# Patient Record
Sex: Female | Born: 1960 | Race: White | Hispanic: No | Marital: Single | Smoking: Never smoker
Health system: Southern US, Community
[De-identification: ages and names within clinical notes are randomized; demographics above are authoritative.]

## PROBLEM LIST (undated history)

## (undated) DIAGNOSIS — Z9889 Other specified postprocedural states: Secondary | ICD-10-CM

## (undated) DIAGNOSIS — M1990 Unspecified osteoarthritis, unspecified site: Secondary | ICD-10-CM

## (undated) DIAGNOSIS — Z8489 Family history of other specified conditions: Secondary | ICD-10-CM

## (undated) DIAGNOSIS — F329 Major depressive disorder, single episode, unspecified: Secondary | ICD-10-CM

## (undated) DIAGNOSIS — G43909 Migraine, unspecified, not intractable, without status migrainosus: Secondary | ICD-10-CM

## (undated) DIAGNOSIS — Z87448 Personal history of other diseases of urinary system: Secondary | ICD-10-CM

## (undated) DIAGNOSIS — M4802 Spinal stenosis, cervical region: Secondary | ICD-10-CM

## (undated) DIAGNOSIS — F3289 Other specified depressive episodes: Secondary | ICD-10-CM

## (undated) DIAGNOSIS — K219 Gastro-esophageal reflux disease without esophagitis: Secondary | ICD-10-CM

## (undated) DIAGNOSIS — J45909 Unspecified asthma, uncomplicated: Secondary | ICD-10-CM

## (undated) DIAGNOSIS — F419 Anxiety disorder, unspecified: Secondary | ICD-10-CM

## (undated) DIAGNOSIS — I499 Cardiac arrhythmia, unspecified: Secondary | ICD-10-CM

## (undated) DIAGNOSIS — G4730 Sleep apnea, unspecified: Secondary | ICD-10-CM

## (undated) DIAGNOSIS — E785 Hyperlipidemia, unspecified: Secondary | ICD-10-CM

## (undated) DIAGNOSIS — C9290 Myeloid leukemia, unspecified, not having achieved remission: Secondary | ICD-10-CM

## (undated) HISTORY — DX: Spinal stenosis, cervical region: M48.02

## (undated) HISTORY — DX: Sleep apnea, unspecified: G47.30

## (undated) HISTORY — DX: Major depressive disorder, single episode, unspecified: F32.9

## (undated) HISTORY — DX: Migraine, unspecified, not intractable, without status migrainosus: G43.909

## (undated) HISTORY — DX: Myeloid leukemia, unspecified, not having achieved remission: C92.90

## (undated) HISTORY — DX: Hyperlipidemia, unspecified: E78.5

## (undated) HISTORY — DX: Personal history of other diseases of urinary system: Z87.448

## (undated) HISTORY — DX: Other specified postprocedural states: Z98.89

## (undated) HISTORY — DX: Unspecified asthma, uncomplicated: J45.909

## (undated) HISTORY — DX: Other specified depressive episodes: F32.89

## (undated) HISTORY — DX: Cardiac arrhythmia, unspecified: I49.9

---
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
## 1984-08-16 HISTORY — PX: SPINAL FUSION: SHX223

## 1998-01-10 ENCOUNTER — Other Ambulatory Visit: Admission: RE | Admit: 1998-01-10 | Discharge: 1998-01-10 | Payer: Self-pay | Admitting: Obstetrics and Gynecology

## 1999-05-01 ENCOUNTER — Other Ambulatory Visit: Admission: RE | Admit: 1999-05-01 | Discharge: 1999-05-01 | Payer: Self-pay | Admitting: Obstetrics and Gynecology

## 2000-05-18 ENCOUNTER — Other Ambulatory Visit: Admission: RE | Admit: 2000-05-18 | Discharge: 2000-05-18 | Payer: Self-pay | Admitting: Obstetrics and Gynecology

## 2001-02-07 ENCOUNTER — Ambulatory Visit (HOSPITAL_COMMUNITY)
Admission: RE | Admit: 2001-02-07 | Discharge: 2001-02-07 | Payer: Self-pay | Admitting: Physical Medicine and Rehabilitation

## 2001-02-07 ENCOUNTER — Encounter: Payer: Self-pay | Admitting: Physical Medicine and Rehabilitation

## 2001-04-10 ENCOUNTER — Other Ambulatory Visit: Admission: RE | Admit: 2001-04-10 | Discharge: 2001-04-10 | Payer: Self-pay | Admitting: Family Medicine

## 2003-09-20 ENCOUNTER — Other Ambulatory Visit: Admission: RE | Admit: 2003-09-20 | Discharge: 2003-09-20 | Payer: Self-pay | Admitting: Family Medicine

## 2006-05-05 ENCOUNTER — Ambulatory Visit: Payer: Self-pay | Admitting: Family Medicine

## 2006-05-16 ENCOUNTER — Ambulatory Visit: Payer: Self-pay | Admitting: Family Medicine

## 2006-09-05 ENCOUNTER — Ambulatory Visit: Payer: Self-pay | Admitting: Family Medicine

## 2006-09-12 ENCOUNTER — Ambulatory Visit: Payer: Self-pay | Admitting: Family Medicine

## 2006-11-30 ENCOUNTER — Ambulatory Visit: Payer: Self-pay | Admitting: Family Medicine

## 2006-12-15 ENCOUNTER — Ambulatory Visit: Payer: Self-pay | Admitting: Family Medicine

## 2006-12-22 ENCOUNTER — Ambulatory Visit: Payer: Self-pay | Admitting: Family Medicine

## 2006-12-30 ENCOUNTER — Ambulatory Visit: Payer: Self-pay | Admitting: Family Medicine

## 2007-01-06 ENCOUNTER — Ambulatory Visit: Payer: Self-pay | Admitting: Family Medicine

## 2007-01-12 ENCOUNTER — Ambulatory Visit: Payer: Self-pay | Admitting: Family Medicine

## 2007-01-19 ENCOUNTER — Ambulatory Visit: Payer: Self-pay | Admitting: Family Medicine

## 2007-01-26 ENCOUNTER — Ambulatory Visit: Payer: Self-pay | Admitting: Family Medicine

## 2007-02-02 ENCOUNTER — Ambulatory Visit: Payer: Self-pay | Admitting: Family Medicine

## 2007-02-09 ENCOUNTER — Ambulatory Visit: Payer: Self-pay | Admitting: Family Medicine

## 2007-02-24 ENCOUNTER — Ambulatory Visit: Payer: Self-pay | Admitting: Family Medicine

## 2007-03-10 ENCOUNTER — Ambulatory Visit: Payer: Self-pay | Admitting: Family Medicine

## 2007-03-17 ENCOUNTER — Ambulatory Visit: Payer: Self-pay | Admitting: Family Medicine

## 2007-03-31 ENCOUNTER — Ambulatory Visit: Payer: Self-pay | Admitting: Family Medicine

## 2007-04-07 ENCOUNTER — Ambulatory Visit: Payer: Self-pay | Admitting: Family Medicine

## 2007-04-14 ENCOUNTER — Ambulatory Visit: Payer: Self-pay | Admitting: Family Medicine

## 2007-04-25 ENCOUNTER — Ambulatory Visit: Payer: Self-pay | Admitting: Family Medicine

## 2007-05-02 ENCOUNTER — Ambulatory Visit: Payer: Self-pay | Admitting: Family Medicine

## 2007-05-08 ENCOUNTER — Ambulatory Visit: Payer: Self-pay | Admitting: Family Medicine

## 2007-05-15 ENCOUNTER — Ambulatory Visit: Payer: Self-pay | Admitting: Family Medicine

## 2007-05-22 ENCOUNTER — Ambulatory Visit: Payer: Self-pay | Admitting: Family Medicine

## 2007-05-29 ENCOUNTER — Ambulatory Visit: Payer: Self-pay | Admitting: Family Medicine

## 2007-06-05 ENCOUNTER — Ambulatory Visit: Payer: Self-pay | Admitting: Family Medicine

## 2007-06-08 ENCOUNTER — Encounter: Admission: RE | Admit: 2007-06-08 | Discharge: 2007-06-08 | Payer: Self-pay | Admitting: Orthopedic Surgery

## 2007-06-13 ENCOUNTER — Ambulatory Visit: Payer: Self-pay | Admitting: Family Medicine

## 2007-06-30 ENCOUNTER — Ambulatory Visit: Payer: Self-pay | Admitting: Family Medicine

## 2007-07-10 ENCOUNTER — Ambulatory Visit: Payer: Self-pay | Admitting: Family Medicine

## 2007-07-24 ENCOUNTER — Ambulatory Visit: Payer: Self-pay | Admitting: Family Medicine

## 2007-07-28 ENCOUNTER — Ambulatory Visit (HOSPITAL_COMMUNITY): Admission: RE | Admit: 2007-07-28 | Discharge: 2007-07-28 | Payer: Self-pay | Admitting: Orthopedic Surgery

## 2007-08-01 ENCOUNTER — Ambulatory Visit: Payer: Self-pay | Admitting: Family Medicine

## 2007-08-22 ENCOUNTER — Ambulatory Visit: Payer: Self-pay | Admitting: Family Medicine

## 2007-09-05 ENCOUNTER — Ambulatory Visit: Payer: Self-pay | Admitting: Family Medicine

## 2007-09-14 ENCOUNTER — Ambulatory Visit: Payer: Self-pay | Admitting: Family Medicine

## 2007-10-04 ENCOUNTER — Ambulatory Visit: Payer: Self-pay | Admitting: Family Medicine

## 2007-10-19 ENCOUNTER — Ambulatory Visit: Payer: Self-pay | Admitting: Family Medicine

## 2007-10-31 ENCOUNTER — Ambulatory Visit: Payer: Self-pay | Admitting: Family Medicine

## 2007-11-14 ENCOUNTER — Ambulatory Visit: Payer: Self-pay | Admitting: Family Medicine

## 2007-11-15 HISTORY — PX: OTHER SURGICAL HISTORY: SHX169

## 2007-12-04 ENCOUNTER — Encounter: Admission: RE | Admit: 2007-12-04 | Discharge: 2007-12-04 | Payer: Self-pay | Admitting: Orthopedic Surgery

## 2007-12-05 ENCOUNTER — Ambulatory Visit: Payer: Self-pay | Admitting: Family Medicine

## 2007-12-22 ENCOUNTER — Ambulatory Visit: Payer: Self-pay | Admitting: Family Medicine

## 2007-12-26 ENCOUNTER — Encounter: Admission: RE | Admit: 2007-12-26 | Discharge: 2007-12-26 | Payer: Self-pay

## 2008-01-05 ENCOUNTER — Ambulatory Visit: Payer: Self-pay | Admitting: Family Medicine

## 2008-01-11 ENCOUNTER — Encounter: Admission: RE | Admit: 2008-01-11 | Discharge: 2008-01-11 | Payer: Self-pay | Admitting: Orthopedic Surgery

## 2008-01-19 ENCOUNTER — Ambulatory Visit: Payer: Self-pay | Admitting: Family Medicine

## 2008-02-02 ENCOUNTER — Ambulatory Visit: Payer: Self-pay | Admitting: Family Medicine

## 2008-03-04 ENCOUNTER — Ambulatory Visit: Payer: Self-pay | Admitting: Family Medicine

## 2008-03-06 ENCOUNTER — Ambulatory Visit: Payer: Self-pay | Admitting: Family Medicine

## 2008-03-16 HISTORY — PX: OPEN REPAIR SPONTANEOUS DISLOCATION HIP: SUR903

## 2008-03-18 ENCOUNTER — Ambulatory Visit: Payer: Self-pay | Admitting: Family Medicine

## 2008-05-20 ENCOUNTER — Ambulatory Visit: Payer: Self-pay | Admitting: Family Medicine

## 2008-05-23 ENCOUNTER — Ambulatory Visit: Payer: Self-pay | Admitting: Family Medicine

## 2008-07-16 HISTORY — PX: OTHER SURGICAL HISTORY: SHX169

## 2008-10-14 LAB — HM MAMMOGRAPHY: HM Mammogram: NORMAL

## 2008-10-23 ENCOUNTER — Encounter: Admission: RE | Admit: 2008-10-23 | Discharge: 2008-10-23 | Payer: Self-pay | Admitting: Neurological Surgery

## 2009-01-02 ENCOUNTER — Ambulatory Visit: Payer: Self-pay | Admitting: Internal Medicine

## 2009-01-02 DIAGNOSIS — I499 Cardiac arrhythmia, unspecified: Secondary | ICD-10-CM | POA: Insufficient documentation

## 2009-01-02 DIAGNOSIS — J45909 Unspecified asthma, uncomplicated: Secondary | ICD-10-CM

## 2009-01-02 DIAGNOSIS — M129 Arthropathy, unspecified: Secondary | ICD-10-CM | POA: Insufficient documentation

## 2009-01-02 DIAGNOSIS — Z9889 Other specified postprocedural states: Secondary | ICD-10-CM

## 2009-01-02 DIAGNOSIS — F419 Anxiety disorder, unspecified: Secondary | ICD-10-CM | POA: Insufficient documentation

## 2009-01-02 DIAGNOSIS — F329 Major depressive disorder, single episode, unspecified: Secondary | ICD-10-CM

## 2009-01-02 DIAGNOSIS — E785 Hyperlipidemia, unspecified: Secondary | ICD-10-CM | POA: Insufficient documentation

## 2009-01-02 DIAGNOSIS — G43909 Migraine, unspecified, not intractable, without status migrainosus: Secondary | ICD-10-CM | POA: Insufficient documentation

## 2009-01-03 ENCOUNTER — Encounter (INDEPENDENT_AMBULATORY_CARE_PROVIDER_SITE_OTHER): Payer: Self-pay | Admitting: *Deleted

## 2009-01-03 LAB — CONVERTED CEMR LAB
Direct LDL: 116.7 mg/dL
TSH: 2.05 microintl units/mL (ref 0.35–5.50)

## 2009-04-22 ENCOUNTER — Ambulatory Visit: Payer: Self-pay | Admitting: Internal Medicine

## 2009-04-22 DIAGNOSIS — H04129 Dry eye syndrome of unspecified lacrimal gland: Secondary | ICD-10-CM | POA: Insufficient documentation

## 2009-04-22 DIAGNOSIS — J029 Acute pharyngitis, unspecified: Secondary | ICD-10-CM | POA: Insufficient documentation

## 2009-04-28 ENCOUNTER — Telehealth: Payer: Self-pay | Admitting: Internal Medicine

## 2009-06-02 ENCOUNTER — Encounter: Payer: Self-pay | Admitting: Internal Medicine

## 2009-08-16 HISTORY — PX: JOINT REPLACEMENT: SHX530

## 2009-10-14 ENCOUNTER — Ambulatory Visit: Payer: Self-pay | Admitting: Internal Medicine

## 2009-10-21 ENCOUNTER — Telehealth: Payer: Self-pay | Admitting: Internal Medicine

## 2009-10-24 ENCOUNTER — Encounter: Admission: RE | Admit: 2009-10-24 | Discharge: 2009-10-24 | Payer: Self-pay | Admitting: Urology

## 2009-10-27 ENCOUNTER — Ambulatory Visit: Payer: Self-pay | Admitting: Internal Medicine

## 2009-11-03 ENCOUNTER — Ambulatory Visit: Payer: Self-pay | Admitting: Internal Medicine

## 2009-11-07 ENCOUNTER — Encounter: Payer: Self-pay | Admitting: Internal Medicine

## 2009-11-12 ENCOUNTER — Encounter: Admission: RE | Admit: 2009-11-12 | Discharge: 2009-11-12 | Payer: Self-pay | Admitting: Orthopedic Surgery

## 2009-12-08 ENCOUNTER — Inpatient Hospital Stay (HOSPITAL_COMMUNITY): Admission: RE | Admit: 2009-12-08 | Discharge: 2009-12-10 | Payer: Self-pay | Admitting: Orthopedic Surgery

## 2009-12-25 ENCOUNTER — Encounter: Payer: Self-pay | Admitting: Internal Medicine

## 2010-05-18 ENCOUNTER — Encounter: Payer: Self-pay | Admitting: Internal Medicine

## 2010-05-27 ENCOUNTER — Encounter: Admission: RE | Admit: 2010-05-27 | Discharge: 2010-05-27 | Payer: Self-pay | Admitting: Orthopedic Surgery

## 2010-06-01 ENCOUNTER — Telehealth: Payer: Self-pay | Admitting: Internal Medicine

## 2010-06-23 ENCOUNTER — Ambulatory Visit (HOSPITAL_COMMUNITY): Admission: RE | Admit: 2010-06-23 | Discharge: 2010-06-23 | Payer: Self-pay | Admitting: Neurological Surgery

## 2010-06-26 ENCOUNTER — Encounter: Payer: Self-pay | Admitting: Internal Medicine

## 2010-09-15 NOTE — Miscellaneous (Signed)
Summary: [facility]  Order/[facility]   Imported By: Lester [place] 12/29/2009 09:52:10  _____________________________________________________________________  External Attachment:    Type:   Image     Comment:   External Document

## 2010-09-15 NOTE — Letter (Signed)
Summary: [facility]opa[facility]ics/[facility]  [facility]opaedics/[facility]   Imported By: Sherian Rein 11/07/2009 08:41:59  _____________________________________________________________________  External Attachment:    Type:   Image     Comment:   External Document

## 2010-09-15 NOTE — Progress Notes (Signed)
Summary: Rx req?  Phone Note Call from Patient   Caller: Patient [phone] Summary of Call: Pt called requesting Rx for pain meds for bulging disc that per pt "Vikki Ports is aware". Pt says that Md has given her pain meds for this previously. Please advise Initial call taken by: Margaret Pyle, CMA,  June 01, 2010 2:24 PM  Follow-up for Phone Call        hydrocodone 7.5/325 #40 with one refill - rx done on EMR historically - may call in or print for pick up and i will sign - thanks Follow-up by: Newt Lukes MD,  June 01, 2010 5:23 PM  Additional Follow-up for Phone Call Additional follow up Details #1::        Rx sign and in cabinet for pt pick up. Pt informed via VM Additional Follow-up by: Margaret Pyle, CMA,  June 02, 2010 8:25 AM    New/Updated Medications: HYDROCODONE-ACETAMINOPHEN 7.5-325 MG TABS (HYDROCODONE-ACETAMINOPHEN) 1 by mouth two times a day as needed for moderate to severe pain symptoms Prescriptions: HYDROCODONE-ACETAMINOPHEN 7.5-325 MG TABS (HYDROCODONE-ACETAMINOPHEN) 1 by mouth two times a day as needed for moderate to severe pain symptoms  #40 x 1   Entered by:   Margaret Pyle, CMA   Authorized by:   Newt Lukes MD   Signed by:   Margaret Pyle, CMA on 06/02/2010   Method used:   Print then Give to Patient   RxID:   [id number] HYDROCODONE-ACETAMINOPHEN 7.5-325 MG TABS (HYDROCODONE-ACETAMINOPHEN) 1 by mouth two times a day as needed for moderate to severe pain symptoms  #40 x 1   Entered and Authorized by:   Newt Lukes MD   Signed by:   Newt Lukes MD on 06/01/2010   Method used:   Historical   RxID:   [id number]

## 2010-09-15 NOTE — Assessment & Plan Note (Signed)
Summary: GO OVER OPTIONS FOR SURGERY/NWS   Vital Signs:  Patient profile:   50 year old female Height:      69 inches (175.26 cm) Weight:      169.12 pounds (76.87 kg) O2 Sat:      96 % on Room air Temp:     98.5 degrees F (36.94 degrees C) oral Pulse rate:   70 / minute BP sitting:   100 / 80  (left arm) Cuff size:   regular  Vitals Entered By: Orlan Leavens (November 03, 2009 3:48 PM)  O2 Flow:  Room air CC: discuss option for surgery Is Patient Diabetic? No Pain Assessment Patient in pain? no        Primary Care Provider:  Newt Lukes MD  CC:  discuss option for surgery.  History of Present Illness: met with ortho from [facility] again -  MRI done - planning for L THR --  Current Medications (verified): 1)  Advair Diskus 100-50 Mcg/dose Misc (Fluticasone-Salmeterol) .... Use 1 Puff Two Times A Day 2)  Hydrocodone-Acetaminophen 5-325 Mg Tabs (Hydrocodone-Acetaminophen) .... Take 1 By Mouth Two Times A Day As Needed For Moderate-Severe Pain 3)  Valium 5 Mg Tabs (Diazepam) .Marland Kitchen.. 1 By Mouth At Bedtime As Needed 4)  Red Yeast Rice 600 Mg Tabs (Red Yeast Rice Extract) .... Take 2 By Mouth Qd 5)  Coq10 100 Mg Caps (Coenzyme Q10) .... Take 1 By Mouth Qd 6)  Calcium 500 Mg Tabs (Calcium Carbonate) 7)  Vitamin D 1000 Unit  Tabs (Cholecalciferol) 8)  Magnesium 200 Mg Tabs (Magnesium) 9)  Fish Oil 1000 Mg Caps (Omega-3 Fatty Acids) 10)  Zomig 5 Mg Soln (Zolmitriptan) .... One Spray in One Nostril As Needed For Migraines 11)  Fluconazole 100 Mg Tabs (Fluconazole) .Marland Kitchen.. 1 By Mouth Once Daily As Needed 12)  Pristiq 50 Mg Xr24h-Tab (Desvenlafaxine Succinate) .... Once Daily  Allergies (verified): 1)  ! Augmentin 2)  ! Morphine 3)  ! * Scapolamine  Past History:  Past Medical History: Asthma Depression Hyperlipidemia Phlebitis arthritis scoliosis hx cervical neck stenosis  MD rooster: NSurg -[facility] -[facility], [facility] @[facility] =  [facility]  Review of Systems       The patient complains of difficulty walking.  The patient denies fever and abdominal pain.    Physical Exam  General:  alert, well-developed, well-nourished, and cooperative to examination.   less emotional Lungs:  normal respiratory effort, no intercostal retractions or use of accessory muscles; normal breath sounds bilaterally - no crackles and no wheezes.    Heart:  normal rate, regular rhythm, no murmur, and no rub. BLE without edema Psych:  Oriented X3, memory intact for recent and remote, normally interactive, good eye contact, not anxious appearing, not depressed   Impression & Recommendations:  Problem # 1:  ARTHRITIS (ICD-716.90)  complicated history with pt reported adverse outcomes (urinary retention, persisting bladder issues) at [facility] in 2010 - now opinion at [facility] to consider THR given DJD and pain symptoms - s/p MRI -- planning THR continue hydrocodone and valium as needed basis until definitive date for surg mgmt decided upon -  ok to extend FMLA at pt request when forms from [employer] here await info from [facility] to review with pt -  Complete Medication List: 1)  Advair Diskus 100-50 Mcg/dose Misc (Fluticasone-salmeterol) .... Use 1 puff two times a day 2)  Hydrocodone-acetaminophen 5-325 Mg Tabs (Hydrocodone-acetaminophen) .... Take 1 by mouth two times  a day as needed for moderate-severe pain 3)  Valium 5 Mg Tabs (Diazepam) .Marland Kitchen.. 1 by mouth at bedtime as needed 4)  Red Yeast Rice 600 Mg Tabs (Red yeast rice extract) .... Take 2 by mouth qd 5)  Coq10 100 Mg Caps (Coenzyme q10) .... Take 1 by mouth qd 6)  Calcium 500 Mg Tabs (Calcium carbonate) 7)  Vitamin D 1000 Unit Tabs (Cholecalciferol) 8)  Magnesium 200 Mg Tabs (Magnesium) 9)  Fish Oil 1000 Mg Caps (Omega-3 fatty acids) 10)  Zomig 5 Mg Soln (Zolmitriptan) .... One spray in one nostril as needed for migraines 11)  Fluconazole 100 Mg Tabs (Fluconazole) .Marland Kitchen.. 1 by mouth once daily as  needed 12)  Pristiq 50 Mg Xr24h-tab (Desvenlafaxine succinate) .... Once daily  Patient Instructions: 1)  it was good to see you today.  2)  will fill out [employer] forms once we know dates from dr. Caswell Corwin. 3)  Please schedule a follow-up appointment as needed.

## 2010-09-15 NOTE — Assessment & Plan Note (Signed)
Summary: DISCUSS ISSUES HAVING WITH OTHER DOCTORS/NWS   Vital Signs:  Patient profile:   50 year old female Height:      69 inches (175.26 cm) Weight:      171.8 pounds (78.09 kg) BMI:     25.46 O2 Sat:      98 % on Room air Temp:     98.3 degrees F (36.83 degrees C) oral Pulse rate:   79 / minute BP sitting:   102 / 72  (left arm) Cuff size:   regular  Vitals Entered By: Orlan Leavens (October 14, 2009 3:43 PM)  O2 Flow:  Room air CC: discuss issues with other doctors Is Patient Diabetic? No Pain Assessment Patient in pain? no        Primary Care Provider:  Newt Lukes MD  CC:  discuss issues with other doctors.  History of Present Illness: here with complaint of worsening hip pain - prior care and tx by [facility] reviewed as well as recent eval and recs made by ortho at [facility] pain complicated by perimenopausal change and depression - pain not helped by mobic so not taking needs refills on hydrocodone (as not rx'd by [facility] ortho) and on valium (not rx'd by Nsurg)  Current Medications (verified): 1)  Mobic 15 Mg Tabs (Meloxicam) .... Take 1po Once Daily 2)  Advair Diskus 100-50 Mcg/dose Misc (Fluticasone-Salmeterol) .... Use 1 Puff Two Times A Day 3)  Hydrocodone-Acetaminophen 5-325 Mg Tabs (Hydrocodone-Acetaminophen) .... Take As Needed At Bedtime 4)  Valium 5 Mg Tabs (Diazepam) .... Take Prn 5)  Red Yeast Rice 600 Mg Tabs (Red Yeast Rice Extract) .... Take 2 By Mouth Qd 6)  Coq10 100 Mg Caps (Coenzyme Q10) .... Take 1 By Mouth Qd 7)  Calcium 500 Mg Tabs (Calcium Carbonate) 8)  Vitamin D 1000 Unit  Tabs (Cholecalciferol) 9)  Magnesium 200 Mg Tabs (Magnesium) 10)  Fish Oil 1000 Mg Caps (Omega-3 Fatty Acids) 11)  Zomig 5 Mg Soln (Zolmitriptan) .... One Spray in One Nostril As Needed For Migraines 12)  Fluconazole 100 Mg Tabs (Fluconazole) .Marland Kitchen.. 1 By Mouth Once Daily As Needed 13)  Pristiq 50 Mg Xr24h-Tab (Desvenlafaxine Succinate) .... Once Daily  Allergies  (verified): 1)  ! Augmentin 2)  ! Morphine 3)  ! * Scapolamine  Past History:  Past Medical History: Asthma Depression Hyperlipidemia Phlebitis arthritis scoliosis hx cervical neck stenosis  MD rooster: NSurg -[facility] -[facility] -[facility], [facility]= [facility]  Past Surgical History: Reviewed history from 04/22/2009 and no changes required. Spinal fusion with harrington rod T5-L3 in 1986 (R) Arthroscopic labral repair (07/2008) - [facility] (L) Arthroscopic labral repair (11/2007) - [facility] open hip dislocation (03/2008) - [facility]  Social History: Reviewed history from 01/02/2009 and no changes required. 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  The patient denies weight loss and incontinence.    Physical Exam  General:  alert, well-developed, well-nourished, and cooperative to examination.   emotional Psych:  Oriented X3, memory intact for recent and remote, normally interactive, good eye contact, not anxious appearing, and tearful at points of the interview.     Impression & Recommendations:  Problem # 1:  ARTHRITIS (ICD-716.90)  complicated history with pt reported adverse outcomes (urinary retention, persisting bladder issues) at [facility] in 2010 -  now opinion at [facility] to consider THR given DJD and pain symptoms - pt considering MRI before any invasive procedures - also considering re-eval at [facility] for opinion on recurrent pain - continue hydrocodone and valium as needed basis until definitive plans for surg mgmt decided upon - new rx given stop mobic will also send for ROI for info from [facility] to review with pt - Time spent with patient 25 minutes, more than 50% of this time was spent counseling patient on options for managment of pain and problems concerning the need for multiple  specialists  Problem # 2:  DEPRESSION (ICD-311)  The following medications were removed from the medication list:    Cymbalta 60 Mg Cpep (Duloxetine hcl) .Marland Kitchen... Take 1 by mouth qd Her updated medication list for this problem includes:    Valium 5 Mg Tabs (Diazepam) .Marland Kitchen... 1 by mouth at bedtime as needed    Pristiq 50 Mg Xr24h-tab (Desvenlafaxine succinate) ..... Once daily  Complete Medication List: 1)  Advair Diskus 100-50 Mcg/dose Misc (Fluticasone-salmeterol) .... Use 1 puff two times a day 2)  Hydrocodone-acetaminophen 5-325 Mg Tabs (Hydrocodone-acetaminophen) .... Take 1 by mouth two times a day as needed for moderate-severe pain 3)  Valium 5 Mg Tabs (Diazepam) .Marland Kitchen.. 1 by mouth at bedtime as needed 4)  Red Yeast Rice 600 Mg Tabs (Red yeast rice extract) .... Take 2 by mouth qd 5)  Coq10 100 Mg Caps (Coenzyme q10) .... Take 1 by mouth qd 6)  Calcium 500 Mg Tabs (Calcium carbonate) 7)  Vitamin D 1000 Unit Tabs (Cholecalciferol) 8)  Magnesium 200 Mg Tabs (Magnesium) 9)  Fish Oil 1000 Mg Caps (Omega-3 fatty acids) 10)  Zomig 5 Mg Soln (Zolmitriptan) .... One spray in one nostril as needed for migraines 11)  Fluconazole 100 Mg Tabs (Fluconazole) .Marland Kitchen.. 1 by mouth once daily as needed 12)  Pristiq 50 Mg Xr24h-tab (Desvenlafaxine succinate) .... Once daily  Patient Instructions: 1)  it was good to see you today.  2)  prescriptions as discussed - 3)  will send for records from [facility]/Dr. stubbs to review - 4)  Please schedule a follow-up appointment after your ortho eval for review, sooner if problems.  Prescriptions: VALIUM 5 MG TABS (DIAZEPAM) 1 by mouth at bedtime as needed  #30 x 2   Entered and Authorized by:   Newt Lukes MD   Signed by:   Newt Lukes MD on 10/14/2009   Method used:   Print then Give to Patient   RxID:   [id number] HYDROCODONE-ACETAMINOPHEN 5-325 MG TABS (HYDROCODONE-ACETAMINOPHEN) take 1 by mouth two times a day as needed for moderate-severe pain   #40 x 2   Entered and Authorized by:   Newt Lukes MD   Signed by:   Newt Lukes MD on 10/14/2009   Method used:   Print then Give to Patient   RxID:   [id number]

## 2010-09-15 NOTE — Letter (Signed)
Summary: [facility]  [facility]   Imported By: Sherian Rein 05/27/2010 09:35:26  _____________________________________________________________________  External Attachment:    Type:   Image     Comment:   External Document

## 2010-09-15 NOTE — Letter (Signed)
Summary: [facility]  [facility]   Imported By: Sherian Rein 07/20/2010 12:15:08  _____________________________________________________________________  External Attachment:    Type:   Image     Comment:   External Document

## 2010-09-15 NOTE — Letter (Signed)
Summary: [facility]opa[facility]  [facility]   Imported By: Sherian Rein 12/03/2009 11:17:37  _____________________________________________________________________  External Attachment:    Type:   Image     Comment:   External Document

## 2010-09-15 NOTE — Progress Notes (Signed)
Summary: out of work?  Phone Note Call from Patient Call back at Home Phone [phone]   Caller: Patient Summary of Call: pt called stating that she was advised to f/u with "hip doctor" per VAL. pt does not have appt until 10/28/2009 and is requesting MD write her out of work until appt with specialist and also sign her temporary disability paperwork. please advise Initial call taken by: Margaret Pyle, CMA,  October 21, 2009 1:35 PM  Follow-up for Phone Call        fine - may generate out of work letter until 3/15 - may drop off temp disability forms but may be charge for forms - thanks Follow-up by: Newt Lukes MD,  October 21, 2009 1:49 PM  Additional Follow-up for Phone Call Additional follow up Details #1::        pt will have [employer] fax forms to MD's attention. pt will prefer to have forms filled out rather than "out of workMetallurgist. I advised pt that there may be a charge to fill out forms and she agreed. Additional Follow-up by: Margaret Pyle, CMA,  October 21, 2009 3:17 PM

## 2010-09-18 NOTE — Consult Note (Signed)
Summary: [facility]  [facility]   Imported By: Sherian Rein 11/18/2009 10:38:43  _____________________________________________________________________  External Attachment:    Type:   Image     Comment:   External Document

## 2010-11-03 LAB — CBC
HCT: 34.8 % — ABNORMAL LOW (ref 36.0–46.0)
HCT: 48.6 % — ABNORMAL HIGH (ref 36.0–46.0)
Hemoglobin: 16.9 g/dL — ABNORMAL HIGH (ref 12.0–15.0)
MCHC: 34.7 g/dL (ref 30.0–36.0)
MCV: 96.5 fL (ref 78.0–100.0)
MCV: 97 fL (ref 78.0–100.0)
Platelets: 210 10*3/uL (ref 150–400)
Platelets: 229 10*3/uL (ref 150–400)
RDW: 15.1 % (ref 11.5–15.5)
RDW: 15.5 % (ref 11.5–15.5)
WBC: 8.9 10*3/uL (ref 4.0–10.5)

## 2010-11-03 LAB — URINALYSIS, ROUTINE W REFLEX MICROSCOPIC
Glucose, UA: NEGATIVE mg/dL
Nitrite: NEGATIVE
Protein, ur: NEGATIVE mg/dL
pH: 5 (ref 5.0–8.0)

## 2010-11-03 LAB — TYPE AND SCREEN: ABO/RH(D): A POS

## 2010-11-03 LAB — BASIC METABOLIC PANEL
BUN: 9 mg/dL (ref 6–23)
Chloride: 103 mEq/L (ref 96–112)
Creatinine, Ser: 0.81 mg/dL (ref 0.4–1.2)
GFR calc non Af Amer: >60 mL/min (ref >60)
Glucose, Bld: 116 mg/dL — ABNORMAL HIGH (ref 70–99)
Potassium: 4.4 mEq/L (ref 3.5–5.1)
Potassium: 5.5 mEq/L — ABNORMAL HIGH (ref 3.5–5.1)
Sodium: 137 mEq/L (ref 135–145)

## 2010-11-03 LAB — DIFFERENTIAL
Eosinophils Relative: 2 % (ref 0–5)
Lymphocytes Relative: 40 % (ref 12–46)
Lymphs Abs: 3.6 10*3/uL (ref 0.7–4.0)
Monocytes Absolute: 0.5 10*3/uL (ref 0.1–1.0)

## 2010-11-03 LAB — PROTIME-INR
Prothrombin Time: 12.9 seconds (ref 11.6–15.2)
Prothrombin Time: 13.5 seconds (ref 11.6–15.2)

## 2010-11-03 LAB — ABO/RH: ABO/RH(D): A POS

## 2010-12-29 NOTE — Op Note (Signed)
Alicia Dunn, Alicia Dunn                ACCOUNT NO.:  [id number]   MEDICAL RECORD NO.:  [id number]          PATIENT TYPE:  AMB   LOCATION:  DAY                          FACILITY:  [facility]   PHYSICIAN:  Ollen Gross, M.D.    DATE OF BIRTH:  12/02/1960   DATE OF PROCEDURE:  07/28/2007  DATE OF DISCHARGE:                               OPERATIVE REPORT   PREOPERATIVE DIAGNOSIS:  Right hip labral tear.   POSTOPERATIVE DIAGNOSES:  1. Right hip labral tear.  2. Acetabular chondral defect.   PROCEDURE:  Right hip arthroscopy with labral debridement and  chondroplasty.   SURGEON:  Ollen Gross, M.D.   ASSISTANT:  Alexzandrew L. Perkins, P.A.-C.   ANESTHESIA:  General.   ESTIMATED BLOOD LOSS:  Minimal.   DRAINS:  None.   COMPLICATIONS:  None.   CONDITION.:  Stable to recovery.   CLINICAL NOTE:  Alicia Dunn is a 50 year old female with severe right hip pain  and mechanical symptoms.  Exam and history suggested a right hip labral  tear.  She had an MRI arthrogram and it demonstrated a labral tear.  She  presents now for arthroscopy with debridement.   PROCEDURE IN DETAIL:  After successful administration of a general  anesthetic, the patient is placed in the left lateral decubitus position  with the right side up.  Her left leg is on a well-padded bed and the  perineal post, well-padded, is placed with the right leg draped over it.  The right foot is put in a well-padded traction boot and under  fluoroscopic guidance, traction is applied to adequately distract the  joint.  The hip is then prepped and draped in the usual sterile fashion.  The spinal needles are passed for the anterior posterior  peritrochanteric portals.  They are felt to enter the joint and then  confirmed to be in the joint by injecting saline through the posterior  needle and having it outflow through the anterior.  The nitinol wires  are placed and then the posterior portal is created and camera and  cannula passed into  the joint.  Arthroscopic visualization proceeds.  The fovea looks normal.  Entire posterior half of the joint looks fine.  Femoral head has minimal chondromalacia.  The majority of the anterior  of the acetabulum looks fine but there is a large labral tear anteriorly  going from approximately 2 o'clock to 5 o'clock position with an  associated anterior chondral defect.  The anterior portal is found to be  in ideal position and we then created the anterior operating site with  the cannulated system.  The shaver is passed into the joint and the  labral tear is debrided back to a stable base and then sealed off with  the ArthroCare device.  It is probed and found to be stable throughout  the extent of the debridement.  The shaver is then used to debride the  anterior chondral defect on the acetabulum.  There is about a 1 x 1-cm  area of delamination anterior-inferior and then some chondromalacia  above that.  The cartilage is debrided back  to a  stable bony base in  that 1 x 1-cm area and then had stable cartilaginous edges adjacent to  it.  I abraded the bone to, hopefully, get some fibrocartilage to form.  The joint was again inspected and no further tears, defects or loose  bodies.  Arthroscopic equipment is removed from the anterior portal and  then 20 mL of 0.25% Marcaine with epinephrine are injected through the  inflow cannula.  The inflow cannula is removed and then traction removed  off the joint.  The incision is then closed with interrupted 4-0 nylon.  She is removed from traction boot and the padded post is removed.  She  is placed in supine position, awakened and transported to recovery in  stable condition.      Ollen Gross, M.D.  Electronically Signed     FA/MEDQ  D:  07/28/2007  T:  07/28/2007  Job:  [id number]

## 2011-02-23 ENCOUNTER — Encounter: Payer: Self-pay | Admitting: Internal Medicine

## 2011-03-18 IMAGING — CR DG CHEST 2V
2 series · 2 of 2 positions shown · non-contrast
Comparison: None.

CLINICAL DATA: Osteoarthritis in the right hip.  Preoperative
respiratory evaluation prior to right hip arthroplasty.

CHEST - 2 VIEW 12/02/2009:

[view not recorded (1 of 2)]
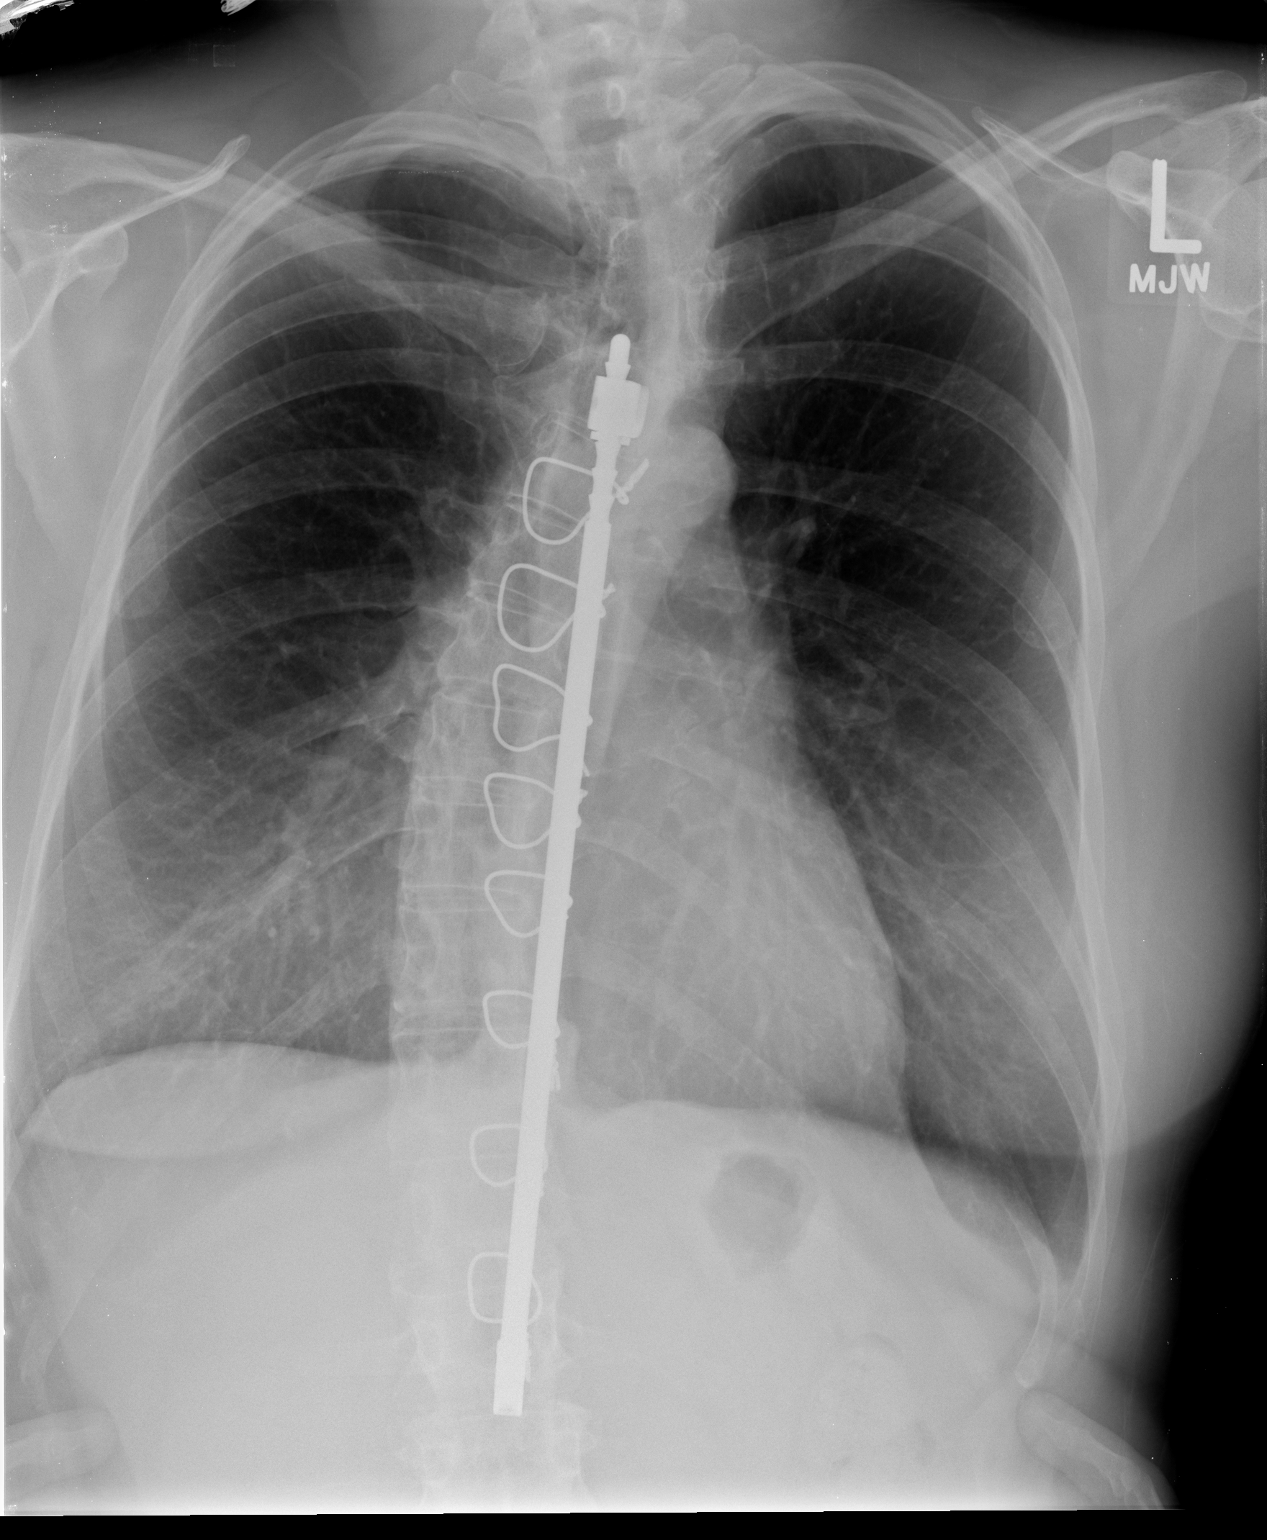

[view not recorded (2 of 2)]
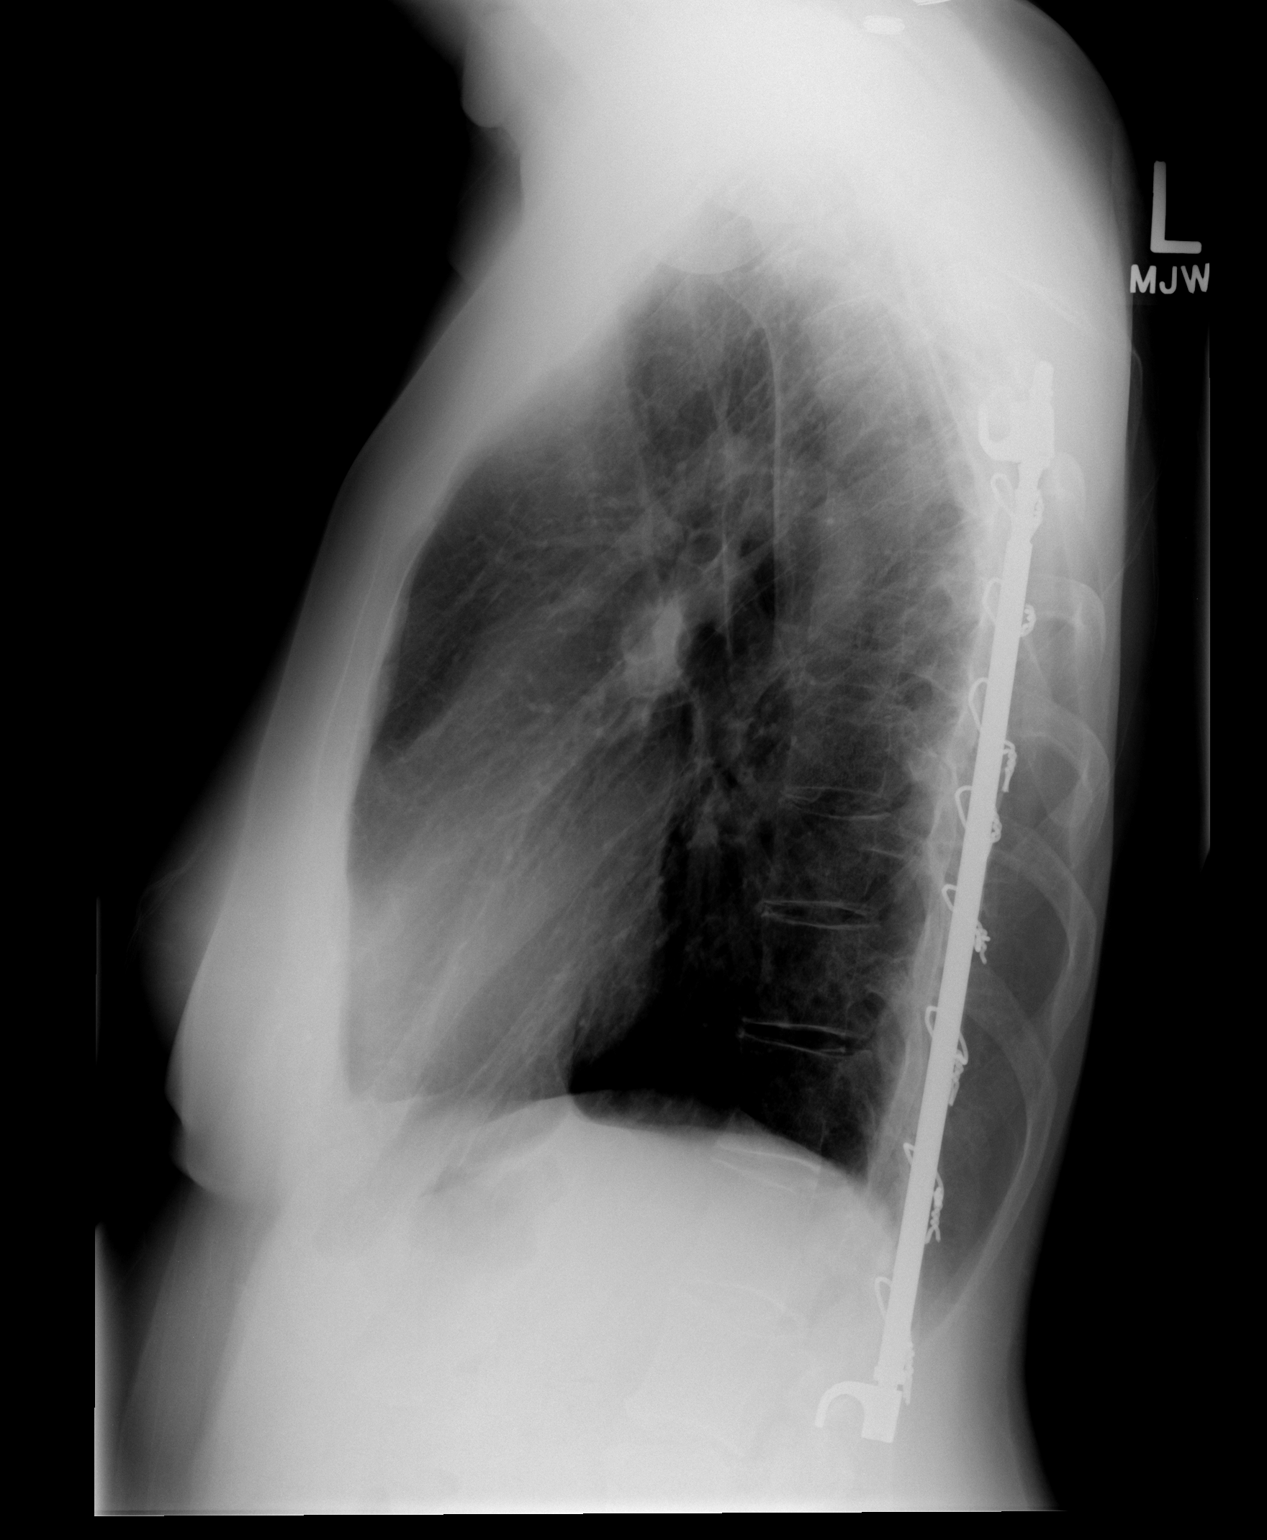

[2 of 2 positions shown; findings below may reference images not displayed]

FINDINGS: Cardiomediastinal silhouette unremarkable.  Lungs clear.
No pleural effusions.  Thoracic scoliosis convex right with
Harrington rod fixation.
IMPRESSION: No acute cardiopulmonary disease.

## 2011-04-14 ENCOUNTER — Other Ambulatory Visit: Payer: Self-pay | Admitting: Orthopedic Surgery

## 2011-04-14 DIAGNOSIS — M25561 Pain in right knee: Secondary | ICD-10-CM

## 2011-04-18 ENCOUNTER — Ambulatory Visit
Admission: RE | Admit: 2011-04-18 | Discharge: 2011-04-18 | Disposition: A | Discharge status: Home / Self Care | Payer: BC Managed Care – PPO | Source: Ambulatory Visit | Attending: Orthopedic Surgery | Admitting: Orthopedic Surgery

## 2011-04-18 DIAGNOSIS — M25561 Pain in right knee: Secondary | ICD-10-CM

## 2011-05-24 LAB — COMPREHENSIVE METABOLIC PANEL
ALT: 18
AST: 21
CO2: 25
Chloride: 106
Creatinine, Ser: 0.99
GFR calc Af Amer: >60
GFR calc non Af Amer: >60
Glucose, Bld: 96
Total Bilirubin: 0.6

## 2011-05-24 LAB — CBC
Hemoglobin: 14.5
MCV: 94
RBC: 4.38
WBC: 9.6

## 2011-05-24 LAB — URINALYSIS, ROUTINE W REFLEX MICROSCOPIC
Bilirubin Urine: NEGATIVE
Ketones, ur: NEGATIVE
Nitrite: NEGATIVE
Protein, ur: NEGATIVE
Urobilinogen, UA: 0.2

## 2011-05-24 LAB — APTT: aPTT: 24

## 2011-05-24 LAB — URINE MICROSCOPIC-ADD ON

## 2011-05-24 LAB — PROTIME-INR: Prothrombin Time: 12.7

## 2011-07-16 ENCOUNTER — Telehealth: Payer: Self-pay | Admitting: Internal Medicine

## 2011-07-16 NOTE — Telephone Encounter (Signed)
Received copies from [facility] 07/16/11. Forwarded  3pages to Dr. Morrison Old review.

## 2012-02-28 ENCOUNTER — Ambulatory Visit
Admission: RE | Admit: 2012-02-28 | Discharge: 2012-02-28 | Disposition: A | Discharge status: Home / Self Care | Payer: BC Managed Care – PPO | Source: Ambulatory Visit | Attending: Allergy and Immunology | Admitting: Allergy and Immunology

## 2012-02-28 ENCOUNTER — Other Ambulatory Visit: Payer: Self-pay | Admitting: Allergy and Immunology

## 2012-02-28 DIAGNOSIS — J209 Acute bronchitis, unspecified: Secondary | ICD-10-CM

## 2012-03-01 ENCOUNTER — Telehealth: Payer: Self-pay

## 2012-03-01 DIAGNOSIS — J45909 Unspecified asthma, uncomplicated: Secondary | ICD-10-CM

## 2012-03-01 NOTE — Telephone Encounter (Signed)
Pt called requesting referral to Dr Shelle Iron - asthma per [employer].

## 2012-03-01 NOTE — Telephone Encounter (Signed)
done

## 2012-03-02 NOTE — Telephone Encounter (Signed)
Notified pt with md response... 03/02/12@10 :18am/LMB

## 2012-04-04 ENCOUNTER — Institutional Professional Consult (permissible substitution): Payer: BC Managed Care – PPO | Admitting: Emergency Medicine

## 2012-04-05 ENCOUNTER — Institutional Professional Consult (permissible substitution): Payer: BC Managed Care – PPO | Admitting: Pulmonary Disease

## 2012-05-17 ENCOUNTER — Other Ambulatory Visit: Payer: Self-pay | Admitting: Rehabilitation

## 2012-05-17 DIAGNOSIS — M549 Dorsalgia, unspecified: Secondary | ICD-10-CM

## 2012-05-30 ENCOUNTER — Ambulatory Visit
Admission: RE | Admit: 2012-05-30 | Discharge: 2012-05-30 | Disposition: A | Discharge status: Home / Self Care | Payer: BC Managed Care – PPO | Source: Ambulatory Visit | Attending: Rehabilitation | Admitting: Rehabilitation

## 2012-05-30 DIAGNOSIS — M549 Dorsalgia, unspecified: Secondary | ICD-10-CM

## 2013-02-27 ENCOUNTER — Other Ambulatory Visit (INDEPENDENT_AMBULATORY_CARE_PROVIDER_SITE_OTHER): Payer: BC Managed Care – PPO

## 2013-02-27 ENCOUNTER — Ambulatory Visit (INDEPENDENT_AMBULATORY_CARE_PROVIDER_SITE_OTHER): Payer: BC Managed Care – PPO | Admitting: Internal Medicine

## 2013-02-27 ENCOUNTER — Encounter: Payer: Self-pay | Admitting: Internal Medicine

## 2013-02-27 VITALS — BP 132/84 | HR 103 | Temp 98.2°F | Wt 172.2 lb

## 2013-02-27 DIAGNOSIS — R300 Dysuria: Secondary | ICD-10-CM

## 2013-02-27 DIAGNOSIS — R21 Rash and other nonspecific skin eruption: Secondary | ICD-10-CM

## 2013-02-27 DIAGNOSIS — G894 Chronic pain syndrome: Secondary | ICD-10-CM

## 2013-02-27 DIAGNOSIS — R1031 Right lower quadrant pain: Secondary | ICD-10-CM

## 2013-02-27 LAB — BASIC METABOLIC PANEL
BUN: 18 mg/dL (ref 6–23)
Calcium: 9.5 mg/dL (ref 8.4–10.5)
GFR: 67.54 mL/min (ref >60.00)
Potassium: 4.1 mEq/L (ref 3.5–5.1)
Sodium: 140 mEq/L (ref 135–145)

## 2013-02-27 LAB — URINALYSIS, ROUTINE W REFLEX MICROSCOPIC
Bilirubin Urine: NEGATIVE
Leukocytes, UA: NEGATIVE
Nitrite: NEGATIVE
Urobilinogen, UA: 0.2 (ref 0.0–1.0)
pH: 6.5 (ref 5.0–8.0)

## 2013-02-27 NOTE — Progress Notes (Signed)
Subjective:    Patient ID: Alicia Dunn, female    DOB: 02/05/1961, 52 y.o.   MRN: [id number]  HPI  Here for followup, reviewed chronic medical issues and interval medical history - last OV 10/2009  complains of pain -chronic in the right groin area Reviewed interval history since last visit including RFA x2 of L3-4 in attempt to control right thigh pain (unsuccessful) Currently maintained on Lyrica and Norco as needed. Also muscle relaxer soma at bedtime Has been released from spine specialist to care of primary care physician,  will need ongoing refills at this office.  Also request specialist evaluation and treatment for cosmetic rash located on lateral edge of both feet  Past Medical History  Diagnosis Date  . ARTHRITIS   . ASTHMA   . CARDIAC ARRHYTHMIA   . DEPRESSION   . HYPERLIPIDEMIA   . MIGRAINE HEADACHE   . RHINOPLASTY, HX OF   . UTI'S, HX OF     Review of Systems  Constitutional: Positive for fatigue (chronic). Negative for fever and unexpected weight change.  Endocrine: Negative for polydipsia, polyphagia and polyuria.  Genitourinary: Positive for dysuria, flank pain and decreased urine volume. Negative for urgency, frequency, hematuria and menstrual problem.  Psychiatric/Behavioral: Negative for suicidal ideas, behavioral problems, confusion and self-injury. The patient is not nervous/anxious.        Objective:   Physical Exam BP 132/84  Pulse 103  Temp(Src) 98.2 F (36.8 C) (Oral)  Wt 172 lb 3.2 oz (78.109 kg)  BMI 25.42 kg/m2  SpO2 96% Wt Readings from Last 3 Encounters:  02/27/13 172 lb 3.2 oz (78.109 kg)  11/03/09 169 lb 1.9 oz (76.712 kg)  10/14/09 171 lb 12.8 oz (77.928 kg)   Constitutional: She appears well-developed and well-nourished. No distress.  Neck: Normal range of motion. Neck supple. No JVD present. No thyromegaly present.  Cardiovascular: Normal rate, regular rhythm and normal heart sounds.  No murmur heard. No BLE  edema. Pulmonary/Chest: Effort normal and breath sounds normal. No respiratory distress. She has no wheezes.  Neurological: She is alert and oriented to person, place, and time. No cranial nerve deficit. Coordination, balance, strength, speech and gait are normal.  Skin: lateral edge of feet bilaterally with tiny, venous, violet colored rash, L>R. Remaining skin is warm and dry. No other rash noted. No erythema.  Psychiatric: She has a normal mood and affect. Her behavior is normal. Judgment and thought content normal.   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 Has seen local varicose veins specialist who recommended "special laser" available only at [facility] (consultation note not available to me at this time for review) - referral to dermatology at [facility] made today as requested  Dysuria/flank  discomfort/decreased urine -history of UTI. Check urinalysis and Bmet; hold empiric antibiotics ulness abnormal test results  Also see problem list. Medications and labs reviewed today.

## 2013-02-27 NOTE — Patient Instructions (Signed)
It was good to see you today. We have reviewed your prior records including labs and tests today Medications reviewed and updated, no changes recommended at this time. We will fill your controlled substances here in place of Dr Retia Passe as discussed so long as there is an office visit with me every 12 months (minimum)  Test(s) ordered today. Your results will be released to MyChart (or called to you) after review, usually within 72hours after test completion. If any changes need to be made, you will be notified at that same time. we'll make referral to [facility] dermatology for foot "rash" as discussed . Our office will contact you regarding appointment(s) once made. Please schedule followup in 2-3 months for physical and labs, call sooner if problems.

## 2013-02-27 NOTE — Assessment & Plan Note (Signed)
Precipitated by long history of neck problems and hip problems Current symptoms greatest in the right groin and thigh area Multiple surgical and epidural interventions reviewed, RFA of L3-4 in 12/2012 unsuccessful Currently, pain management with Lyrica, soma at bedtime and Norco 7.5 as needed Reviewed [place] controlled substance registry today will fill medications here so long as no longer following with Dr. Retia Passe pending other plans for repeat injection/surgery

## 2013-03-16 ENCOUNTER — Other Ambulatory Visit: Payer: Self-pay | Admitting: Orthopedic Surgery

## 2013-03-16 DIAGNOSIS — M25551 Pain in right hip: Secondary | ICD-10-CM

## 2013-03-16 DIAGNOSIS — T888XXD Other specified complications of surgical and medical care, not elsewhere classified, subsequent encounter: Secondary | ICD-10-CM

## 2013-03-19 ENCOUNTER — Other Ambulatory Visit (HOSPITAL_COMMUNITY): Payer: Self-pay | Admitting: Orthopedic Surgery

## 2013-03-19 DIAGNOSIS — M25551 Pain in right hip: Secondary | ICD-10-CM

## 2013-03-19 DIAGNOSIS — T888XXD Other specified complications of surgical and medical care, not elsewhere classified, subsequent encounter: Secondary | ICD-10-CM

## 2013-03-21 ENCOUNTER — Other Ambulatory Visit: Payer: BC Managed Care – PPO

## 2013-03-22 ENCOUNTER — Telehealth: Payer: Self-pay | Admitting: *Deleted

## 2013-03-22 MED ORDER — HYDROCODONE-ACETAMINOPHEN 7.5-325 MG PO TABS: 1.0000 | ORAL_TABLET | Freq: Four times a day (QID) | ORAL | Status: DC | PRN

## 2013-03-22 NOTE — Telephone Encounter (Signed)
[facility]. It medication was not sent to pt, she needs to call them

## 2013-03-22 NOTE — Telephone Encounter (Signed)
Pt called requesting refill on Hydrocodone, pt was seen on 7.15.14 and thought she was to get an Rx at that time but did not.  Please advise.

## 2013-03-22 NOTE — Telephone Encounter (Signed)
[facility] spoke with Estate manager/land agent verify if hydrocodone rx was received on 02/27/13. Alicia Dunn stated that they b=never received script. Pt is was wanting rx sent to her local pharmacy. Called [facility] gave authorization for hydrocodone since [facility]. Notified pt with status update,,,,/lmb

## 2013-03-23 ENCOUNTER — Ambulatory Visit (HOSPITAL_COMMUNITY): Payer: BC Managed Care – PPO

## 2013-04-17 ENCOUNTER — Encounter: Payer: BC Managed Care – PPO | Admitting: Internal Medicine

## 2013-04-17 DIAGNOSIS — Z0289 Encounter for other administrative examinations: Secondary | ICD-10-CM

## 2013-05-23 ENCOUNTER — Ambulatory Visit: Payer: BC Managed Care – PPO

## 2013-07-18 ENCOUNTER — Encounter: Payer: Self-pay | Admitting: Internal Medicine

## 2013-07-18 ENCOUNTER — Ambulatory Visit (INDEPENDENT_AMBULATORY_CARE_PROVIDER_SITE_OTHER): Payer: BC Managed Care – PPO | Admitting: Internal Medicine

## 2013-07-18 ENCOUNTER — Ambulatory Visit (INDEPENDENT_AMBULATORY_CARE_PROVIDER_SITE_OTHER): Payer: BC Managed Care – PPO

## 2013-07-18 VITALS — BP 120/80 | HR 107 | Temp 98.2°F | Ht 68.5 in | Wt 164.0 lb

## 2013-07-18 DIAGNOSIS — Z1211 Encounter for screening for malignant neoplasm of colon: Secondary | ICD-10-CM

## 2013-07-18 DIAGNOSIS — Z0000 Encounter for general adult medical examination without abnormal findings: Secondary | ICD-10-CM

## 2013-07-18 DIAGNOSIS — G4730 Sleep apnea, unspecified: Secondary | ICD-10-CM

## 2013-07-18 DIAGNOSIS — M2550 Pain in unspecified joint: Secondary | ICD-10-CM

## 2013-07-18 LAB — RHEUMATOID FACTOR: Rhuematoid fact SerPl-aCnc: <10 IU/mL (ref <=14)

## 2013-07-18 LAB — CBC WITH DIFFERENTIAL/PLATELET
Basophils Absolute: 0 10*3/uL (ref 0.0–0.1)
Eosinophils Absolute: 0.1 10*3/uL (ref 0.0–0.7)
HCT: 49.2 % — ABNORMAL HIGH (ref 36.0–46.0)
Hemoglobin: 16.4 g/dL — ABNORMAL HIGH (ref 12.0–15.0)
Lymphocytes Relative: 27.3 % (ref 12.0–46.0)
MCHC: 33.3 g/dL (ref 30.0–36.0)
Monocytes Absolute: 0.4 10*3/uL (ref 0.1–1.0)
Neutro Abs: 6.2 10*3/uL (ref 1.4–7.7)
Neutrophils Relative %: 66.6 % (ref 43.0–77.0)
RDW: 15.5 % — ABNORMAL HIGH (ref 11.5–14.6)

## 2013-07-18 LAB — BASIC METABOLIC PANEL
CO2: 31 mEq/L (ref 19–32)
Calcium: 10.3 mg/dL (ref 8.4–10.5)
Creatinine, Ser: 0.9 mg/dL (ref 0.4–1.2)
Glucose, Bld: 96 mg/dL (ref 70–99)
Sodium: 143 mEq/L (ref 135–145)

## 2013-07-18 LAB — URINALYSIS, ROUTINE W REFLEX MICROSCOPIC
Bilirubin Urine: NEGATIVE
Hgb urine dipstick: NEGATIVE
Ketones, ur: NEGATIVE
Nitrite: NEGATIVE
Total Protein, Urine: NEGATIVE
Urine Glucose: NEGATIVE
pH: 6 (ref 5.0–8.0)

## 2013-07-18 LAB — LIPID PANEL
Cholesterol: 267 mg/dL — ABNORMAL HIGH (ref 0–200)
HDL: 59.7 mg/dL (ref >39.00)
Total CHOL/HDL Ratio: 4
Triglycerides: 273 mg/dL — ABNORMAL HIGH (ref 0.0–149.0)

## 2013-07-18 LAB — HEPATIC FUNCTION PANEL
Albumin: 4.4 g/dL (ref 3.5–5.2)
Bilirubin, Direct: 0.1 mg/dL (ref 0.0–0.3)
Total Bilirubin: 0.4 mg/dL (ref 0.3–1.2)
Total Protein: 7.5 g/dL (ref 6.0–8.3)

## 2013-07-18 LAB — LDL CHOLESTEROL, DIRECT: Direct LDL: 166 mg/dL

## 2013-07-18 MED ORDER — CARISOPRODOL 350 MG PO TABS: 350.0000 mg | ORAL_TABLET | Freq: Every day | ORAL | Status: DC

## 2013-07-18 MED ORDER — PANTOPRAZOLE SODIUM 40 MG PO TBEC: 40.0000 mg | DELAYED_RELEASE_TABLET | Freq: Every day | ORAL | Status: DC

## 2013-07-18 NOTE — Progress Notes (Signed)
Subjective:    Patient ID: Alicia Dunn, female    DOB: [birth year], 52 y.o.   MRN: [id number]  HPI  patient is here today for annual physical. Patient feels well and has no complaints.  Also reviewed chronic medical issues and interval medical events  Past Medical History  Diagnosis Date  . ARTHRITIS   . ASTHMA   . CARDIAC ARRHYTHMIA   . DEPRESSION   . HYPERLIPIDEMIA   . MIGRAINE HEADACHE   . RHINOPLASTY, HX OF   . UTI'S, HX OF   . Cervical stenosis of spinal canal    Family History  Problem Relation Age of Onset  . Arthritis Mother   . Hyperlipidemia Mother   . Arthritis Other   . Colon cancer Other   . Diabetes Other     grandparent  . Lung cancer Other    History  Substance Use Topics  . Smoking status: Never Smoker   . Smokeless tobacco: Not on file     Comment: separated 01/2013. 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 Alcohol Use: Not on file    Review of Systems  Constitutional: Negative for fatigue and unexpected weight change.  Respiratory: Positive for cough (occ, with asthma) and wheezing (occ). Negative for shortness of breath.   Cardiovascular: Negative for chest pain, palpitations and leg swelling.  Gastrointestinal: Negative for nausea, abdominal pain and diarrhea.  Neurological: Negative for dizziness, weakness, light-headedness and headaches.  Psychiatric/Behavioral: Negative for dysphoric mood. The patient is not nervous/anxious.   All other systems reviewed and are negative.       Objective:   Physical Exam  BP 120/80  Pulse 107  Temp(Src) 98.2 F (36.8 C) (Oral)  Ht 5' 8.5" (1.74 m)  Wt 164 lb (74.39 kg)  BMI 24.57 kg/m2  SpO2 98% Wt Readings from Last 3 Encounters:  07/18/13 164 lb (74.39 kg)  02/27/13 172 lb 3.2 oz (78.109 kg)  11/03/09 169 lb 1.9 oz (76.712 kg)   Constitutional: She appears well-developed and well-nourished. No distress.  HENT: Head: Normocephalic and atraumatic. Ears: B TMs  ok, no erythema or effusion; Nose: Nose normal. Mouth/Throat: Oropharynx is clear and moist. No oropharyngeal exudate.  Eyes: Conjunctivae and EOM are normal. Pupils are equal, round, and reactive to light. No scleral icterus.  Neck: Normal range of motion. Neck supple. No JVD present. No thyromegaly present.  Cardiovascular: Normal rate, regular rhythm and normal heart sounds.  No murmur heard. No BLE edema. Pulmonary/Chest: Effort normal and breath sounds normal. No respiratory distress. She has no wheezes.  Abdominal: Soft. Bowel sounds are normal. She exhibits no distension. There is no tenderness. no masses Musculoskeletal: Normal range of motion, no joint effusions. No gross deformities Neurological: She is alert and oriented to person, place, and time. No cranial nerve deficit. Coordination, balance, strength, speech and gait are normal.  Skin: Skin is warm and dry. No rash noted. No erythema.  Psychiatric: She has a normal mood and affect. Her behavior is normal. Judgment and thought content normal.   Lab Results  Component Value Date   WBC 10.0 12/10/2009   HGB 10.7* 12/10/2009   HCT 31.0* 12/10/2009   PLT 210 12/10/2009   GLUCOSE 81 02/27/2013   CHOL 223* 01/02/2009   TRIG 89.0 01/02/2009   HDL 90.00 01/02/2009   LDLDIRECT 116.7 01/02/2009   ALT 18 07/28/2007   AST 21 07/28/2007   NA 140 02/27/2013   K 4.1 02/27/2013  CL 104 02/27/2013   CREATININE 0.9 02/27/2013   BUN 18 02/27/2013   CO2 31 02/27/2013   TSH 2.05 01/02/2009   INR 1.04 12/09/2009        Assessment & Plan:   CPX/v70.0 - Patient has been counseled on age-appropriate routine health concerns for screening and prevention. These are reviewed and up-to-date. Immunizations are up-to-date or declined. Labs ordered and reviewed.  Refer for cologaurd as pt declines colonoscopy due to prep and time away from work  Sleep problems - ?apnea - refer for sleep eval - no med changes recommended at this time Also See problem list.  Medications and labs reviewed today.

## 2013-07-18 NOTE — Progress Notes (Signed)
Pre-visit discussion using our clinic review tool. No additional management support is needed unless otherwise documented below in the visit note.  

## 2013-07-18 NOTE — Patient Instructions (Addendum)
It was good to see you today.  We have reviewed your prior records including labs and tests today  Health Maintenance reviewed - all recommended immunizations and age-appropriate screenings are up-to-date.  We'll have you screened for colon cancer with COLOGAURD (stool DNA testing) - this packet will be sent to your home by the testing company. Complete instructions are in the box, and we will contact you with the results once available.  Test(s) ordered today. Your results will be released to MyChart (or called to you) after review, usually within 72hours after test completion. If any changes need to be made, you will be notified at that same time.  Medications reviewed and updated, no changes recommended at this time. Refill on medication(s) as discussed today. Discuss with Meridith ?amitryptline use  we'll make referral to Dr Dareen Piano for rheumatology evaluation and to pulmonary for sleep evalution . Our office will contact you regarding appointment(s) once made.  Please schedule followup in 12 months for annual exam, call sooner if problems.  Health Maintenance, Female A healthy lifestyle and preventative care can promote health and wellness.  Maintain regular health, dental, and eye exams.  Eat a healthy diet. Foods like vegetables, fruits, whole grains, low-fat dairy products, and lean protein foods contain the nutrients you need without too many calories. Decrease your intake of foods high in solid fats, added sugars, and salt. Get information about a proper diet from your caregiver, if necessary.  Regular physical exercise is one of the most important things you can do for your health. Most adults should get at least 150 minutes of moderate-intensity exercise (any activity that increases your heart rate and causes you to sweat) each week. In addition, most adults need muscle-strengthening exercises on 2 or more days a week.   Maintain a healthy weight. The body mass index (BMI) is  a screening tool to identify possible weight problems. It provides an estimate of body fat based on height and weight. Your caregiver can help determine your BMI, and can help you achieve or maintain a healthy weight. For adults 20 years and older:  A BMI below 18.5 is considered underweight.  A BMI of 18.5 to 24.9 is normal.  A BMI of 25 to 29.9 is considered overweight.  A BMI of 30 and above is considered obese.  Maintain normal blood lipids and cholesterol by exercising and minimizing your intake of saturated fat. Eat a balanced diet with plenty of fruits and vegetables. Blood tests for lipids and cholesterol should begin at age 71 and be repeated every 5 years. If your lipid or cholesterol levels are high, you are over 50, or you are a high risk for heart disease, you may need your cholesterol levels checked more frequently.Ongoing high lipid and cholesterol levels should be treated with medicines if diet and exercise are not effective.  If you smoke, find out from your caregiver how to quit. If you do not use tobacco, do not start.  Lung cancer screening is recommended for adults aged 74 80 years who are at high risk for developing lung cancer because of a history of smoking. Yearly low-dose computed tomography (CT) is recommended for people who have at least a 30-pack-year history of smoking and are a current smoker or have quit within the past 15 years. A pack year of smoking is smoking an average of 1 pack of cigarettes a day for 1 year (for example: 1 pack a day for 30 years or 2 packs a day for  15 years). Yearly screening should continue until the smoker has stopped smoking for at least 15 years. Yearly screening should also be stopped for people who develop a health problem that would prevent them from having lung cancer treatment.  If you are pregnant, do not drink alcohol. If you are breastfeeding, be very cautious about drinking alcohol. If you are not pregnant and choose to drink  alcohol, do not exceed 1 drink per day. One drink is considered to be 12 ounces (355 mL) of beer, 5 ounces (148 mL) of wine, or 1.5 ounces (44 mL) of liquor.  Avoid use of street drugs. Do not share needles with anyone. Ask for help if you need support or instructions about stopping the use of drugs.  High blood pressure causes heart disease and increases the risk of stroke. Blood pressure should be checked at least every 1 to 2 years. Ongoing high blood pressure should be treated with medicines, if weight loss and exercise are not effective.  If you are 11 to 51 years old, ask your caregiver if you should take aspirin to prevent strokes.  Diabetes screening involves taking a blood sample to check your fasting blood sugar level. This should be done once every 3 years, after age 88, if you are within normal weight and without risk factors for diabetes. Testing should be considered at a younger age or be carried out more frequently if you are overweight and have at least 1 risk factor for diabetes.  Breast cancer screening is essential preventative care for women. You should practice "breast self-awareness." This means understanding the normal appearance and feel of your breasts and may include breast self-examination. Any changes detected, no matter how small, should be reported to a caregiver. Women in their 57s and 30s should have a clinical breast exam (CBE) by a caregiver as part of a regular health exam every 1 to 3 years. After age 47, women should have a CBE every year. Starting at age 58, women should consider having a mammogram (breast X-ray) every year. Women who have a family history of breast cancer should talk to their caregiver about genetic screening. Women at a high risk of breast cancer should talk to their caregiver about having an MRI and a mammogram every year.  Breast cancer gene (BRCA)-related cancer risk assessment is recommended for women who have family members with BRCA-related  cancers. BRCA-related cancers include breast, ovarian, tubal, and peritoneal cancers. Having family members with these cancers may be associated with an increased risk for harmful changes (mutations) in the breast cancer genes BRCA1 and BRCA2. Results of the assessment will determine the need for genetic counseling and BRCA1 and BRCA2 testing.  The Pap test is a screening test for cervical cancer. Women should have a Pap test starting at age 16. Between ages 53 and 48, Pap tests should be repeated every 2 years. Beginning at age 24, you should have a Pap test every 3 years as long as the past 3 Pap tests have been normal. If you had a hysterectomy for a problem that was not cancer or a condition that could lead to cancer, then you no longer need Pap tests. If you are between ages 68 and 68, and you have had normal Pap tests going back 10 years, you no longer need Pap tests. If you have had past treatment for cervical cancer or a condition that could lead to cancer, you need Pap tests and screening for cancer for at least 20 years  after your treatment. If Pap tests have been discontinued, risk factors (such as a new sexual partner) need to be reassessed to determine if screening should be resumed. Some women have medical problems that increase the chance of getting cervical cancer. In these cases, your caregiver may recommend more frequent screening and Pap tests.  The human papillomavirus (HPV) test is an additional test that may be used for cervical cancer screening. The HPV test looks for the virus that can cause the cell changes on the cervix. The cells collected during the Pap test can be tested for HPV. The HPV test could be used to screen women aged 4 years and older, and should be used in women of any age who have unclear Pap test results. After the age of 35, women should have HPV testing at the same frequency as a Pap test.  Colorectal cancer can be detected and often prevented. Most routine  colorectal cancer screening begins at the age of 53 and continues through age 42. However, your caregiver may recommend screening at an earlier age if you have risk factors for colon cancer. On a yearly basis, your caregiver may provide home test kits to check for hidden blood in the stool. Use of a small camera at the end of a tube, to directly examine the colon (sigmoidoscopy or colonoscopy), can detect the earliest forms of colorectal cancer. Talk to your caregiver about this at age 48, when routine screening begins. Direct examination of the colon should be repeated every 5 to 10 years through age 73, unless early forms of pre-cancerous polyps or small growths are found.  Hepatitis C blood testing is recommended for all people born from [birth year] through 1964 and any individual with known risks for hepatitis C.  Practice safe sex. Use condoms and avoid high-risk sexual practices to reduce the spread of sexually transmitted infections (STIs). Sexually active women aged 33 and younger should be checked for Chlamydia, which is a common sexually transmitted infection. Older women with new or multiple partners should also be tested for Chlamydia. Testing for other STIs is recommended if you are sexually active and at increased risk.  Osteoporosis is a disease in which the bones lose minerals and strength with aging. This can result in serious bone fractures. The risk of osteoporosis can be identified using a bone density scan. Women ages 18 and over and women at risk for fractures or osteoporosis should discuss screening with their caregivers. Ask your caregiver whether you should be taking a calcium supplement or vitamin D to reduce the rate of osteoporosis.  Menopause can be associated with physical symptoms and risks. Hormone replacement therapy is available to decrease symptoms and risks. You should talk to your caregiver about whether hormone replacement therapy is right for you.  Use sunscreen. Apply  sunscreen liberally and repeatedly throughout the day. You should seek shade when your shadow is shorter than you. Protect yourself by wearing long sleeves, pants, a wide-brimmed hat, and sunglasses year round, whenever you are outdoors.  Notify your caregiver of new moles or changes in moles, especially if there is a change in shape or color. Also notify your caregiver if a mole is larger than the size of a pencil eraser.  Stay current with your immunizations. Document Released: 02/15/2011 Document Revised: 11/27/2012 Document Reviewed: 02/15/2011 [facility] Patient Information 2014 [place], [place].

## 2013-07-18 NOTE — Assessment & Plan Note (Signed)
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

## 2013-07-19 LAB — ANTI-NUCLEAR AB-TITER (ANA TITER): ANA Titer 1: 1:160 {titer} — ABNORMAL HIGH

## 2013-08-28 ENCOUNTER — Institutional Professional Consult (permissible substitution): Payer: BC Managed Care – PPO | Admitting: Pulmonary Disease

## 2013-08-29 ENCOUNTER — Institutional Professional Consult (permissible substitution): Payer: BC Managed Care – PPO | Admitting: Pulmonary Disease

## 2013-10-15 ENCOUNTER — Other Ambulatory Visit: Payer: Self-pay | Admitting: *Deleted

## 2013-10-15 MED ORDER — MELOXICAM 15 MG PO TABS: 15.0000 mg | ORAL_TABLET | Freq: Every day | ORAL | Status: DC

## 2013-10-23 ENCOUNTER — Other Ambulatory Visit: Payer: Self-pay | Admitting: *Deleted

## 2013-10-23 MED ORDER — MELOXICAM 15 MG PO TABS: 15.0000 mg | ORAL_TABLET | Freq: Every day | ORAL | Status: DC

## 2014-04-21 ENCOUNTER — Other Ambulatory Visit: Payer: Self-pay | Admitting: Internal Medicine

## 2014-07-17 ENCOUNTER — Other Ambulatory Visit: Payer: Self-pay | Admitting: Internal Medicine

## 2014-08-08 ENCOUNTER — Encounter: Payer: BC Managed Care – PPO | Admitting: Internal Medicine

## 2014-08-20 ENCOUNTER — Ambulatory Visit (INDEPENDENT_AMBULATORY_CARE_PROVIDER_SITE_OTHER): Payer: 59 | Admitting: Family

## 2014-08-20 ENCOUNTER — Encounter: Payer: Self-pay | Admitting: Family

## 2014-08-20 VITALS — BP 110/80 | HR 93 | Temp 98.3°F | Resp 18 | Ht 68.5 in | Wt 166.8 lb

## 2014-08-20 DIAGNOSIS — M722 Plantar fascial fibromatosis: Secondary | ICD-10-CM | POA: Insufficient documentation

## 2014-08-20 DIAGNOSIS — Z9189 Other specified personal risk factors, not elsewhere classified: Secondary | ICD-10-CM

## 2014-08-20 DIAGNOSIS — Z0000 Encounter for general adult medical examination without abnormal findings: Secondary | ICD-10-CM | POA: Insufficient documentation

## 2014-08-20 DIAGNOSIS — Z23 Encounter for immunization: Secondary | ICD-10-CM

## 2014-08-20 MED ORDER — QUETIAPINE FUMARATE ER 150 MG PO TB24
150.0000 mg | ORAL_TABLET | Freq: Every day | ORAL | Status: DC
Start: 2014-08-20 — End: 2014-12-10

## 2014-08-20 MED ORDER — MOMETASONE FURO-FORMOTEROL FUM 200-5 MCG/ACT IN AERO: 2.0000 | INHALATION_SPRAY | Freq: Two times a day (BID) | RESPIRATORY_TRACT | Status: DC

## 2014-08-20 MED ORDER — PANTOPRAZOLE SODIUM 40 MG PO TBEC: 40.0000 mg | DELAYED_RELEASE_TABLET | Freq: Every day | ORAL | Status: DC

## 2014-08-20 MED ORDER — MELOXICAM 15 MG PO TABS: 15.0000 mg | ORAL_TABLET | Freq: Every day | ORAL | Status: DC

## 2014-08-20 NOTE — Progress Notes (Signed)
Subjective:    Patient ID: Alicia Dunn, female    DOB: [birth year], 54 y.o.   MRN: [id number]  Chief Complaint  Patient presents with  . CPE    med refill on, pantoprazole, dulera, meloxicam, not fasting,     HPI:  Alicia Dunn is a 54 y.o. female who presents today for an annual wellness visit.   1) Health Maintenance - Right now feeling a little less than great, but overall ok.   Diet - diet is good - eating clean Exercise - Walks 20-30 min sessions per day.    2) Preventative Exams / Immunizations:  Dental -- Up to date Vision -- Up to date   Health Maintenance  Topic Date Due  . TETANUS/TDAP  02/02/1981  . COLONOSCOPY  02/03/2012  . MAMMOGRAM  05/16/2014  . INFLUENZA VACCINE  03/17/2015  . PAP SMEAR  01/15/2016    Will schedule mammogram Immunization History  Administered Date(s) Administered  . Influenza-Unspecified 05/16/2013    Allergies  Allergen Reactions  . Amoxicillin-Pot Clavulanate   . Iohexol      Desc: PREMED WITH BENADRYL   . Morphine     Current Outpatient Prescriptions on File Prior to Visit  Medication Sig Dispense Refill  . calcium gluconate 500 MG tablet Take 500 mg by mouth daily.      . carisoprodol (SOMA) 350 MG tablet Take 1 tablet (350 mg total) by mouth at bedtime. 90 tablet 1  . HYDROcodone-acetaminophen (NORCO) 7.5-325 MG per tablet Take 1 tablet by mouth every 6 (six) hours as needed for pain. 60 tablet 2  . Omega-3 Fatty Acids (FISH OIL) 1000 MG CAPS Take by mouth daily.      . pregabalin (LYRICA) 50 MG capsule Take 1 capsule (50 mg total) by mouth 2 (two) times daily.    Marland Kitchen zolmitriptan (ZOMIG) 5 MG nasal solution Place into the nose as needed.       No current facility-administered medications on file prior to visit.    Past Medical History  Diagnosis Date  . ARTHRITIS   . ASTHMA   . CARDIAC ARRHYTHMIA   . DEPRESSION   . HYPERLIPIDEMIA   . MIGRAINE HEADACHE   . RHINOPLASTY, HX OF   . UTI'S, HX OF   . Cervical  stenosis of spinal canal     Family History  Problem Relation Age of Onset  . Arthritis Mother   . Hyperlipidemia Mother   . Arthritis Other   . Colon cancer Other   . Diabetes Other     grandparent  . Lung cancer Other     History   Social History  . Marital Status: Single    Spouse Name: N/A    Number of Children: N/A  . Years of Education: N/A   Occupational History  . Not on file.   Social History Main Topics  . Smoking status: Never Smoker   . Smokeless tobacco: Not on file     Comment: separated 01/2013. 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 Alcohol Use: Not on file  . Drug Use: Not on file  . Sexual Activity: Not on file   Other Topics Concern  . Not on file   Social History Narrative    Review of Systems  Constitutional: Denies fever, chills, fatigue, or significant weight gain/loss. HENT: Head: Denies headache or neck pain Ears: Denies changes in hearing, ringing in ears, earache, drainage Nose: Denies discharge, stuffiness, itching, nosebleed,  sinus pain Throat: Denies sore throat, hoarseness, dry mouth, sores, thrush Eyes: Denies loss/changes in vision, pain, redness, blurry/double vision, flashing lights Cardiovascular: Denies chest pain/discomfort, tightness, palpitations, shortness of breath with activity, difficulty lying down, swelling, sudden awakening with shortness of breath Respiratory: Denies shortness of breath, cough, sputum production, wheezing Gastrointestinal: Denies dysphasia, heartburn, change in appetite, nausea, change in bowel habits, rectal bleeding, constipation, diarrhea, yellow skin or eyes Genitourinary: Denies frequency, urgency, burning/pain, blood in urine, incontinence, change in urinary strength. Musculoskeletal: Denies muscle/joint pain, stiffness, back pain, redness or swelling of joints, trauma Plantar facitiis. - has been going on for a couple of weeks and has tried multiple treatments.  Walking is excruciating.  Skin: Denies rashes, lumps, itching, dryness, color changes, or hair/nail changes Neurological: Denies dizziness, fainting, seizures, weakness, numbness, tingling, tremor Psychiatric - Denies nervousness, stress, depression or memory loss Endocrine: Denies heat or cold intolerance, sweating, frequent urination, excessive thirst, changes in appetite Hematologic: Denies ease of bruising or bleeding     Objective:    BP 110/80 mmHg  Pulse 93  Temp(Src) 98.3 F (36.8 C) (Oral)  Resp 18  Ht 5' 8.5" (1.74 m)  Wt 166 lb 12.8 oz (75.66 kg)  BMI 24.99 kg/m2  SpO2 97% Nursing note and vital signs reviewed.  Physical Exam  Constitutional: She is oriented to person, place, and time. She appears well-developed and well-nourished.  HENT:  Head: Normocephalic.  Right Ear: Hearing, tympanic membrane, external ear and ear canal normal.  Left Ear: Hearing, tympanic membrane, external ear and ear canal normal.  Nose: Nose normal.  Mouth/Throat: Uvula is midline, oropharynx is clear and moist and mucous membranes are normal.  Eyes: Conjunctivae and EOM are normal. Pupils are equal, round, and reactive to light.  Neck: Neck supple. No JVD present. No tracheal deviation present. No thyromegaly present.  Cardiovascular: Normal rate, regular rhythm, normal heart sounds and intact distal pulses.   Pulmonary/Chest: Effort normal and breath sounds normal.  Abdominal: Soft. Bowel sounds are normal. She exhibits no distension and no mass. There is no tenderness. There is no rebound and no guarding.  Musculoskeletal: Normal range of motion. She exhibits no edema or tenderness.  No obvious deformity, discoloration or edema of bilateral feet. Tenderness elicited along plantar fascia.   Lymphadenopathy:    She has no cervical adenopathy.  Neurological: She is alert and oriented to person, place, and time. She has normal reflexes. No cranial nerve deficit. She exhibits normal muscle  tone. Coordination normal.  Skin: Skin is warm and dry.  Psychiatric: She has a normal mood and affect. Her behavior is normal. Judgment and thought content normal.       Assessment & Plan:

## 2014-08-20 NOTE — Assessment & Plan Note (Addendum)
1) Anticipatory Guidance: Discussed importance of wearing a seatbelt while driving and not texting while driving; changing batteries in smoke detector at least once annually; wearing suntan lotion when outside; eating a balanced and moderate diet; getting physical activity at least 30 minutes per day.  2) Immunizations / Screenings / Labs:  Tetanus and PPSV23 given today. All other immunizations are up to date per recommendations. Due for colonoscopy - referred to GI and mammogram - patient will schedule. All other recommended screenings are up to date. Obtain CBC, BMET, Lipid profile and TSH.   Overall well exam. Discussed patient risk factors of hypercholesterolemia. Will determine status with new lipid profile. Recommend to continue current healthy lifestyle choices. Follow up prevention exam in 1 year or sooner pending lab work.

## 2014-08-20 NOTE — Patient Instructions (Signed)
Thank you for choosing [facility].  Summary/Instructions:  Your prescription(s) have been submitted to your pharmacy or been printed and provided for you. Please take as directed and contact our office if you believe you are having problem(s) with the medication(s) or have any questions.  Please stop by the lab on the basement level of the building for your blood work. Your results will be released to MyChart (or called to you) after review, usually within 72 hours after test completion. If any changes need to be made, you will be notified at that same time.  Health Maintenance Adopting a healthy lifestyle and getting preventive care can go a long way to promote health and wellness. Talk with your health care provider about what schedule of regular examinations is right for you. This is a good chance for you to check in with your provider about disease prevention and staying healthy. In between checkups, there are plenty of things you can do on your own. Experts have done a lot of research about which lifestyle changes and preventive measures are most likely to keep you healthy. Ask your health care provider for more information. WEIGHT AND DIET  Eat a healthy diet  Be sure to include plenty of vegetables, fruits, low-fat dairy products, and lean protein.  Do not eat a lot of foods high in solid fats, added sugars, or salt.  Get regular exercise. This is one of the most important things you can do for your health.  Most adults should exercise for at least 150 minutes each week. The exercise should increase your heart rate and make you sweat (moderate-intensity exercise).  Most adults should also do strengthening exercises at least twice a week. This is in addition to the moderate-intensity exercise.  Maintain a healthy weight  Body mass index (BMI) is a measurement that can be used to identify possible weight problems. It estimates body fat based on height and weight. Your health  care provider can help determine your BMI and help you achieve or maintain a healthy weight.  For females 20 years of age and older:   A BMI below 18.5 is considered underweight.  A BMI of 18.5 to 24.9 is normal.  A BMI of 25 to 29.9 is considered overweight.  A BMI of 30 and above is considered obese.  Watch levels of cholesterol and blood lipids  You should start having your blood tested for lipids and cholesterol at 53 years of age, then have this test every 5 years.  You may need to have your cholesterol levels checked more often if:  Your lipid or cholesterol levels are high.  You are older than 53 years of age.  You are at high risk for heart disease.  CANCER SCREENING   Lung Cancer  Lung cancer screening is recommended for adults 55-80 years old who are at high risk for lung cancer because of a history of smoking.  A yearly low-dose CT scan of the lungs is recommended for people who:  Currently smoke.  Have quit within the past 15 years.  Have at least a 30-pack-year history of smoking. A pack year is smoking an average of one pack of cigarettes a day for 1 year.  Yearly screening should continue until it has been 15 years since you quit.  Yearly screening should stop if you develop a health problem that would prevent you from having lung cancer treatment.  Breast Cancer  Practice breast self-awareness. This means understanding how your breasts normally appear   and feel.  It also means doing regular breast self-exams. Let your health care provider know about any changes, no matter how small.  If you are in your 20s or 30s, you should have a clinical breast exam (CBE) by a health care provider every 1-3 years as part of a regular health exam.  If you are 40 or older, have a CBE every year. Also consider having a breast X-ray (mammogram) every year.  If you have a family history of breast cancer, talk to your health care provider about genetic  screening.  If you are at high risk for breast cancer, talk to your health care provider about having an MRI and a mammogram every year.  Breast cancer gene (BRCA) assessment is recommended for women who have family members with BRCA-related cancers. BRCA-related cancers include:  Breast.  Ovarian.  Tubal.  Peritoneal cancers.  Results of the assessment will determine the need for genetic counseling and BRCA1 and BRCA2 testing. Cervical Cancer Routine pelvic examinations to screen for cervical cancer are no longer recommended for nonpregnant women who are considered low risk for cancer of the pelvic organs (ovaries, uterus, and vagina) and who do not have symptoms. A pelvic examination may be necessary if you have symptoms including those associated with pelvic infections. Ask your health care provider if a screening pelvic exam is right for you.   The Pap test is the screening test for cervical cancer for women who are considered at risk.  If you had a hysterectomy for a problem that was not cancer or a condition that could lead to cancer, then you no longer need Pap tests.  If you are older than 65 years, and you have had normal Pap tests for the past 10 years, you no longer need to have Pap tests.  If you have had past treatment for cervical cancer or a condition that could lead to cancer, you need Pap tests and screening for cancer for at least 20 years after your treatment.  If you no longer get a Pap test, assess your risk factors if they change (such as having a new sexual partner). This can affect whether you should start being screened again.  Some women have medical problems that increase their chance of getting cervical cancer. If this is the case for you, your health care provider may recommend more frequent screening and Pap tests.  The human papillomavirus (HPV) test is another test that may be used for cervical cancer screening. The HPV test looks for the virus that can  cause cell changes in the cervix. The cells collected during the Pap test can be tested for HPV.  The HPV test can be used to screen women 30 years of age and older. Getting tested for HPV can extend the interval between normal Pap tests from three to five years.  An HPV test also should be used to screen women of any age who have unclear Pap test results.  After 53 years of age, women should have HPV testing as often as Pap tests.  Colorectal Cancer  This type of cancer can be detected and often prevented.  Routine colorectal cancer screening usually begins at 53 years of age and continues through 53 years of age.  Your health care provider may recommend screening at an earlier age if you have risk factors for colon cancer.  Your health care provider may also recommend using home test kits to check for hidden blood in the stool.  A small   camera at the end of a tube can be used to examine your colon directly (sigmoidoscopy or colonoscopy). This is done to check for the earliest forms of colorectal cancer.  Routine screening usually begins at age 47.  Direct examination of the colon should be repeated every 5-10 years through 53 years of age. However, you may need to be screened more often if early forms of precancerous polyps or small growths are found. Skin Cancer  Check your skin from head to toe regularly.  Tell your health care provider about any new moles or changes in moles, especially if there is a change in a mole's shape or color.  Also tell your health care provider if you have a mole that is larger than the size of a pencil eraser.  Always use sunscreen. Apply sunscreen liberally and repeatedly throughout the day.  Protect yourself by wearing long sleeves, pants, a wide-brimmed hat, and sunglasses whenever you are outside. HEART DISEASE, DIABETES, AND HIGH BLOOD PRESSURE   Have your blood pressure checked at least every 1-2 years. High blood pressure causes heart  disease and increases the risk of stroke.  If you are between 61 years and 67 years old, ask your health care provider if you should take aspirin to prevent strokes.  Have regular diabetes screenings. This involves taking a blood sample to check your fasting blood sugar level.  If you are at a normal weight and have a low risk for diabetes, have this test once every three years after 53 years of age.  If you are overweight and have a high risk for diabetes, consider being tested at a younger age or more often. PREVENTING INFECTION  Hepatitis B  If you have a higher risk for hepatitis B, you should be screened for this virus. You are considered at high risk for hepatitis B if:  You were born in a country where hepatitis B is common. Ask your health care provider which countries are considered high risk.  Your parents were born in a high-risk country, and you have not been immunized against hepatitis B (hepatitis B vaccine).  You have HIV or AIDS.  You use needles to inject street drugs.  You live with someone who has hepatitis B.  You have had sex with someone who has hepatitis B.  You get hemodialysis treatment.  You take certain medicines for conditions, including cancer, organ transplantation, and autoimmune conditions. Hepatitis C  Blood testing is recommended for:  Everyone born from [birth year] through 1964.  Anyone with known risk factors for hepatitis C. Sexually transmitted infections (STIs)  You should be screened for sexually transmitted infections (STIs) including gonorrhea and chlamydia if:  You are sexually active and are younger than 53 years of age.  You are older than 53 years of age and your health care provider tells you that you are at risk for this type of infection.  Your sexual activity has changed since you were last screened and you are at an increased risk for chlamydia or gonorrhea. Ask your health care provider if you are at risk.  If you do not have  HIV, but are at risk, it may be recommended that you take a prescription medicine daily to prevent HIV infection. This is called pre-exposure prophylaxis (PrEP). You are considered at risk if:  You are sexually active and do not regularly use condoms or know the HIV status of your partner(s).  You take drugs by injection.  You are sexually active with a  partner who has HIV. Talk with your health care provider about whether you are at high risk of being infected with HIV. If you choose to begin PrEP, you should first be tested for HIV. You should then be tested every 3 months for as long as you are taking PrEP.  PREGNANCY   If you are premenopausal and you may become pregnant, ask your health care provider about preconception counseling.  If you may become pregnant, take 400 to 800 micrograms (mcg) of folic acid every day.  If you want to prevent pregnancy, talk to your health care provider about birth control (contraception). OSTEOPOROSIS AND MENOPAUSE   Osteoporosis is a disease in which the bones lose minerals and strength with aging. This can result in serious bone fractures. Your risk for osteoporosis can be identified using a bone density scan.  If you are 65 years of age or older, or if you are at risk for osteoporosis and fractures, ask your health care provider if you should be screened.  Ask your health care provider whether you should take a calcium or vitamin D supplement to lower your risk for osteoporosis.  Menopause may have certain physical symptoms and risks.  Hormone replacement therapy may reduce some of these symptoms and risks. Talk to your health care provider about whether hormone replacement therapy is right for you.  HOME CARE INSTRUCTIONS   Schedule regular health, dental, and eye exams.  Stay current with your immunizations.   Do not use any tobacco products including cigarettes, chewing tobacco, or electronic cigarettes.  If you are pregnant, do not  drink alcohol.  If you are breastfeeding, limit how much and how often you drink alcohol.  Limit alcohol intake to no more than 1 drink per day for nonpregnant women. One drink equals 12 ounces of beer, 5 ounces of wine, or 1 ounces of hard liquor.  Do not use street drugs.  Do not share needles.  Ask your health care provider for help if you need support or information about quitting drugs.  Tell your health care provider if you often feel depressed.  Tell your health care provider if you have ever been abused or do not feel safe at home. Document Released: 02/15/2011 Document Revised: 12/17/2013 Document Reviewed: 07/04/2013 ExitCare Patient Information 2015 [employer]. This information is not intended to replace advice given to you by your health care provider. Make sure you discuss any questions you have with your health care provider.   

## 2014-08-20 NOTE — Progress Notes (Signed)
Pre visit review using our clinic review tool, if applicable. No additional management support is needed unless otherwise documented below in the visit note. 

## 2014-08-20 NOTE — Assessment & Plan Note (Signed)
Symptoms and exam consistent with plantar fascitis. Given previous treatment trials by patient and desire to hold cortisone injection, will refer to physical therapy per patient request. Continue mobic as needed for pain. If no resolution with PT, will refer to sports medicine for further management.

## 2014-10-13 ENCOUNTER — Other Ambulatory Visit: Payer: Self-pay | Admitting: Family

## 2014-10-16 ENCOUNTER — Other Ambulatory Visit: Payer: Self-pay

## 2014-10-16 LAB — HM COLONOSCOPY

## 2014-10-16 MED ORDER — MELOXICAM 15 MG PO TABS: 15.0000 mg | ORAL_TABLET | Freq: Every day | ORAL | Status: DC

## 2014-11-19 ENCOUNTER — Encounter: Payer: Self-pay | Admitting: Internal Medicine

## 2014-12-10 ENCOUNTER — Other Ambulatory Visit: Payer: Self-pay | Admitting: Family

## 2014-12-10 NOTE — Telephone Encounter (Signed)
Ok to refill 

## 2014-12-23 ENCOUNTER — Encounter: Payer: Self-pay | Admitting: Internal Medicine

## 2015-01-08 ENCOUNTER — Other Ambulatory Visit: Payer: Self-pay | Admitting: Family

## 2015-01-12 ENCOUNTER — Other Ambulatory Visit: Payer: Self-pay | Admitting: Family

## 2015-02-05 ENCOUNTER — Other Ambulatory Visit: Payer: Self-pay | Admitting: Family

## 2015-03-21 ENCOUNTER — Other Ambulatory Visit: Payer: Self-pay | Admitting: Family

## 2015-05-13 ENCOUNTER — Encounter: Payer: Self-pay | Admitting: Family

## 2015-05-18 ENCOUNTER — Encounter: Payer: Self-pay | Admitting: Family

## 2015-05-19 ENCOUNTER — Encounter: Payer: Self-pay | Admitting: Family

## 2015-05-19 NOTE — Telephone Encounter (Signed)
Patient is calling to follow up on the above request regarding getting a letter of accomodation. No response at this point? Please let me know if i can assist. Please advise patient once completed.

## 2015-06-19 ENCOUNTER — Other Ambulatory Visit: Payer: Self-pay | Admitting: Family

## 2015-06-25 ENCOUNTER — Other Ambulatory Visit (HOSPITAL_COMMUNITY): Payer: Self-pay | Admitting: Orthopedic Surgery

## 2015-06-25 ENCOUNTER — Other Ambulatory Visit: Payer: Self-pay

## 2015-06-25 DIAGNOSIS — M25551 Pain in right hip: Secondary | ICD-10-CM

## 2015-06-25 MED ORDER — MELOXICAM 15 MG PO TABS: 15.0000 mg | ORAL_TABLET | Freq: Every day | ORAL | Status: AC

## 2015-07-03 ENCOUNTER — Ambulatory Visit (HOSPITAL_COMMUNITY)
Admission: RE | Admit: 2015-07-03 | Discharge: 2015-07-03 | Disposition: A | Discharge status: Home / Self Care | Payer: 59 | Source: Ambulatory Visit | Attending: Orthopedic Surgery | Admitting: Orthopedic Surgery

## 2015-07-03 DIAGNOSIS — M25551 Pain in right hip: Secondary | ICD-10-CM | POA: Insufficient documentation

## 2015-07-10 LAB — COLOGUARD

## 2015-08-04 ENCOUNTER — Other Ambulatory Visit: Payer: Self-pay | Admitting: Internal Medicine

## 2015-08-04 ENCOUNTER — Other Ambulatory Visit: Payer: Self-pay | Admitting: Orthopedic Surgery

## 2015-08-13 ENCOUNTER — Other Ambulatory Visit (HOSPITAL_COMMUNITY): Payer: Self-pay | Admitting: *Deleted

## 2015-08-13 ENCOUNTER — Encounter: Payer: Self-pay | Admitting: Internal Medicine

## 2015-08-14 ENCOUNTER — Encounter (HOSPITAL_COMMUNITY): Payer: Self-pay

## 2015-08-14 ENCOUNTER — Encounter (HOSPITAL_COMMUNITY)
Admission: RE | Admit: 2015-08-14 | Discharge: 2015-08-14 | Disposition: A | Discharge status: Home / Self Care | Payer: 59 | Source: Ambulatory Visit | Attending: Orthopedic Surgery | Admitting: Orthopedic Surgery

## 2015-08-14 ENCOUNTER — Ambulatory Visit (HOSPITAL_COMMUNITY)
Admission: RE | Admit: 2015-08-14 | Discharge: 2015-08-14 | Disposition: A | Discharge status: Home / Self Care | Payer: 59 | Source: Ambulatory Visit | Attending: Orthopedic Surgery | Admitting: Orthopedic Surgery

## 2015-08-14 DIAGNOSIS — J45909 Unspecified asthma, uncomplicated: Secondary | ICD-10-CM | POA: Insufficient documentation

## 2015-08-14 DIAGNOSIS — Z01810 Encounter for preprocedural cardiovascular examination: Secondary | ICD-10-CM | POA: Insufficient documentation

## 2015-08-14 DIAGNOSIS — Z01818 Encounter for other preprocedural examination: Secondary | ICD-10-CM | POA: Insufficient documentation

## 2015-08-14 DIAGNOSIS — Z01812 Encounter for preprocedural laboratory examination: Secondary | ICD-10-CM | POA: Diagnosis not present

## 2015-08-14 DIAGNOSIS — R9431 Abnormal electrocardiogram [ECG] [EKG]: Secondary | ICD-10-CM | POA: Diagnosis not present

## 2015-08-14 HISTORY — DX: Unspecified osteoarthritis, unspecified site: M19.90

## 2015-08-14 HISTORY — DX: Gastro-esophageal reflux disease without esophagitis: K21.9

## 2015-08-14 HISTORY — DX: Family history of other specified conditions: Z84.89

## 2015-08-14 HISTORY — DX: Anxiety disorder, unspecified: F41.9

## 2015-08-14 LAB — CBC WITH DIFFERENTIAL/PLATELET
Basophils Absolute: 0 10*3/uL (ref 0.0–0.1)
Basophils Relative: 0 %
Eosinophils Absolute: 0.2 10*3/uL (ref 0.0–0.7)
Eosinophils Relative: 2 %
HEMATOCRIT: 52.3 % — AB (ref 36.0–46.0)
Hemoglobin: 17.4 g/dL — ABNORMAL HIGH (ref 12.0–15.0)
LYMPHS PCT: 39 %
Lymphs Abs: 2.6 10*3/uL (ref 0.7–4.0)
MCH: 31.3 pg (ref 26.0–34.0)
MCHC: 33.3 g/dL (ref 30.0–36.0)
MCV: 94.1 fL (ref 78.0–100.0)
MONO ABS: 0.4 10*3/uL (ref 0.1–1.0)
MONOS PCT: 5 %
NEUTROS ABS: 3.5 10*3/uL (ref 1.7–7.7)
Neutrophils Relative %: 54 %
Platelets: 247 10*3/uL (ref 150–400)
RBC: 5.56 MIL/uL — ABNORMAL HIGH (ref 3.87–5.11)
RDW: 15.4 % (ref 11.5–15.5)
WBC: 6.6 10*3/uL (ref 4.0–10.5)

## 2015-08-14 LAB — BASIC METABOLIC PANEL
ANION GAP: 11 (ref 5–15)
BUN: 15 mg/dL (ref 6–20)
CO2: 27 mmol/L (ref 22–32)
Calcium: 10 mg/dL (ref 8.9–10.3)
Chloride: 104 mmol/L (ref 101–111)
Creatinine, Ser: 0.9 mg/dL (ref 0.44–1.00)
GFR calc Af Amer: >60 mL/min (ref >60)
GFR calc non Af Amer: >60 mL/min (ref >60)
GLUCOSE: 93 mg/dL (ref 65–99)
POTASSIUM: 4.7 mmol/L (ref 3.5–5.1)
Sodium: 142 mmol/L (ref 135–145)

## 2015-08-14 LAB — PROTIME-INR
INR: 1.04 (ref 0.00–1.49)
Prothrombin Time: 13.8 seconds (ref 11.6–15.2)

## 2015-08-14 LAB — URINALYSIS, ROUTINE W REFLEX MICROSCOPIC
Bilirubin Urine: NEGATIVE
Glucose, UA: NEGATIVE mg/dL
HGB URINE DIPSTICK: NEGATIVE
Ketones, ur: NEGATIVE mg/dL
NITRITE: NEGATIVE
PROTEIN: NEGATIVE mg/dL
Specific Gravity, Urine: 1.014 (ref 1.005–1.030)
pH: 5.5 (ref 5.0–8.0)

## 2015-08-14 LAB — URINE MICROSCOPIC-ADD ON

## 2015-08-14 LAB — TYPE AND SCREEN
ABO/RH(D): A POS
Antibody Screen: NEGATIVE

## 2015-08-14 LAB — SURGICAL PCR SCREEN
MRSA, PCR: NEGATIVE
Staphylococcus aureus: NEGATIVE

## 2015-08-14 LAB — APTT: aPTT: 29 seconds (ref 24–37)

## 2015-08-14 NOTE — Pre-Procedure Instructions (Signed)
Alicia Dunn  08/14/2015      [facility] - [address], [place] [address] Phone: [phone] Fax: [phone]  [facility] [address] - [place], [place] [address] AT [facility] [address] [place] [address] [place] [address] Phone: [phone] Fax: [phone]  [place], [place] [address] [place] [address] [place] [address] Phone: [phone] Fax: [phone]    Your procedure is scheduled on 08/25/2015  Report to [facility] Admitting at 6:45 A.M.  Call this number if you have problems the morning of surgery:  [phone]   Remember:  Do not eat food or drink liquids after midnight.  On SUNDAY   Take these medicines the morning of surgery with A SIP OF WATER : PROTONIX, MUSCLE RELAXER, (PAIN MEDICINE- as needed)              Stop Fish oil & Mobic after 1-12/2015   Do not wear jewelry, make-up or nail polish.  Do not wear lotions, powders, or perfumes.  You may wear deodorant.  Do not shave 48 hours prior to surgery.    Do not bring valuables to the hospital.  [facility] is not responsible for any belongings or valuables.  Contacts, dentures or bridgework may not be worn into surgery.  Leave your suitcase in the car.  After surgery it may be brought to your room.  For patients admitted to the hospital, discharge time will be determined by your treatment team.  Patients discharged the day of surgery will not be allowed to drive home.   Name and phone number of your driver:   /w daughter  Special instructions:  Special Instructions: [facility] - Preparing for Surgery  Before surgery, you can play an important role.  Because skin is not sterile, your skin needs to be as free of germs as possible.  You can reduce the number of germs on you skin by washing with CHG (chlorahexidine gluconate) soap before  surgery.  CHG is an antiseptic cleaner which kills germs and bonds with the skin to continue killing germs even after washing.  Please DO NOT use if you have an allergy to CHG or antibacterial soaps.  If your skin becomes reddened/irritated stop using the CHG and inform your nurse when you arrive at Short Stay.  Do not shave (including legs and underarms) for at least 48 hours prior to the first CHG shower.  You may shave your face.  Please follow these instructions carefully:   1.  Shower with CHG Soap the night before surgery and the  morning of Surgery.  2.  If you choose to wash your hair, wash your hair first as usual with your  normal shampoo.  3.  After you shampoo, rinse your hair and body thoroughly to remove the  Shampoo.  4.  Use CHG as you would any other liquid soap.  You can apply chg directly to the skin and wash gently with scrungie or a clean washcloth.  5.  Apply the CHG Soap to your body ONLY FROM THE NECK DOWN.    Do not use on open wounds or open sores.  Avoid contact with your eyes, ears, mouth and genitals (private parts).  Wash genitals (private parts)   with your normal soap.  6.  Wash thoroughly, paying special attention to the area  where your surgery will be performed.  7.  Thoroughly rinse your body with warm water from the neck down.  8.  DO NOT shower/wash with your normal soap after using and rinsing off   the CHG Soap.  9.  Pat yourself dry with a clean towel.            10.  Wear clean pajamas.            11.  Place clean sheets on your bed the night of your first shower and do not sleep with pets.  Day of Surgery  Do not apply any lotions/deodorants the morning of surgery.  Please wear clean clothes to the hospital/surgery center.  Please read over the following fact sheets that you were given. Pain Booklet, Coughing and Deep Breathing, Blood Transfusion Information, Total Joint Packet, MRSA Information and Surgical Site Infection Prevention

## 2015-08-14 NOTE — Progress Notes (Signed)
Pt. Reports prior to a surgery approx. 5 yrs. Ago there was evidence of an irreg. Heart rate & it was followed up by a ECHO which was wnl.

## 2015-08-22 NOTE — H&P (Signed)
TOTAL HIP REVISION ADMISSION H&P  Patient is admitted for right revision total hip arthroplasty.  Subjective:  Chief Complaint: right hip pain  HPI: Alicia Dunn, 55 y.o. female, has a history of pain and functional disability in the right hip due to arthritis and patient has failed non-surgical conservative treatments for greater than 12 weeks to include NSAID's and/or analgesics, use of assistive devices, weight reduction as appropriate and activity modification. The indications for the revision total hip arthroplasty are bearing surface wear leading to  symptomatic synovitis.  Onset of symptoms was gradual starting 6 years ago with gradually worsening course since that time.  Prior procedures on the right hip include arthroplasty.  Patient currently rates pain in the right hip at 10 out of 10 with activity.  There is worsening of pain with activity and weight bearing, pain that interfers with activities of daily living and clunking. Patient has evidence of scan showing a fluid collection wrapping around the greater trochanter by imaging studies.  This condition presents safety issues increasing the risk of falls.   There is no current active infection.  Patient Active Problem List   Diagnosis Date Noted  . Routine general medical examination at a health care facility 08/20/2014  . Plantar fasciitis 08/20/2014  . Arthralgia   . Chronic pain syndrome 02/27/2013  . DRY EYE SYNDROME 04/22/2009  . HYPERLIPIDEMIA 01/02/2009  . DEPRESSION 01/02/2009  . MIGRAINE HEADACHE 01/02/2009  . CARDIAC ARRHYTHMIA 01/02/2009  . ASTHMA 01/02/2009  . ARTHRITIS 01/02/2009  . RHINOPLASTY, HX OF 01/02/2009   Past Medical History  Diagnosis Date  . ARTHRITIS   . CARDIAC ARRHYTHMIA   . DEPRESSION   . HYPERLIPIDEMIA   . RHINOPLASTY, HX OF   . UTI'S, HX OF   . Cervical stenosis of spinal canal   . Family history of adverse reaction to anesthesia     N&V- Mother of pt.   . ASTHMA     has Dulera for PRN  use, not used in past month   . Anxiety     + panic attacks in past   . Kidney infection     2010 , perhaps, unsure of date  . GERD (gastroesophageal reflux disease)   . MIGRAINE HEADACHE     Zomig nasal spray, but hasn't used in long time  . Arthritis     OA, hip, back- lumbar spine     Past Surgical History  Procedure Laterality Date  . Spinal fusion  1986    Harrington rod T5-L3  . Right arthroscopic labral repair  07/2008  . Arthroscopic labral repair Left 11/2007  . Open repair spontaneous dislocation hip  03/2008    @ [facility]  . Joint replacement Right 2011    hip  . Rhinoplasty  1981    No prescriptions prior to admission   Allergies  Allergen Reactions  . Scopolamine Other (See Comments)    Other Reaction: Migraines  . Oxycodone-Acetaminophen Other (See Comments)    Other Reaction: hallucinations Hypersensitive, /w itching   . Iohexol      Desc: PREMED WITH BENADRYL, only oral contrast /w CAT, not MRI contrast    . Morphine     hallucinations  . Tramadol Other (See Comments)    Other Reaction: hallucinations, sweating  . Augmentin [Amoxicillin-Pot Clavulanate] Rash    + abdominal cramping     Social History  Substance Use Topics  . Smoking status: Never Smoker   . Smokeless tobacco: Not on file  Comment: separated 01/2013. 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 Alcohol Use: Yes     Comment: glass of wine, once per week     Family History  Problem Relation Age of Onset  . Arthritis Mother   . Hyperlipidemia Mother   . Arthritis Other   . Colon cancer Other   . Diabetes Other     grandparent  . Lung cancer Other       Review of Systems  Constitutional: Negative.   HENT: Negative.   Eyes:       Glasses  Respiratory:       Asthma  Cardiovascular: Negative.   Gastrointestinal: Negative.   Genitourinary: Negative.   Musculoskeletal: Positive for joint pain.  Skin: Negative.   Neurological: Negative.    Endo/Heme/Allergies: Negative.   Psychiatric/Behavioral: Positive for depression.    Objective:  Physical Exam  Constitutional: She appears well-developed and well-nourished.  HENT:  Head: Normocephalic and atraumatic.  Eyes: Pupils are equal, round, and reactive to light.  Neck: Normal range of motion. Neck supple.  Cardiovascular: Intact distal pulses.   Respiratory: Effort normal.  Musculoskeletal: Normal range of motion.  Surgical scar is well-healed.  Full range of motion of the right hip, foot tap is negative.    Skin: Skin is warm and dry.  Psychiatric: She has a normal mood and affect. Her behavior is normal. Judgment and thought content normal.    Vital signs in last 24 hours:     Labs:   Estimated body mass index is 24.99 kg/(m^2) as calculated from the following:   Height as of 08/20/14: 5' 8.5" (1.74 m).   Weight as of 08/20/14: 75.66 kg (166 lb 12.8 oz).  Imaging Review:  She has had an MRI scan showing a fluid collection wrapping around the greater trochanter  Assessment/Plan:  End stage arthritis, right hip(s) with failed previous arthroplasty. Probable Metaline reaction.   The patient history, physical examination, clinical judgement of the provider and imaging studies are consistent with end stage degenerative joint disease of the right hip(s), previous total hip arthroplasty. Revision total hip arthroplasty is deemed medically necessary. The treatment options including medical management, injection therapy, arthroscopy and arthroplasty were discussed at length. The risks and benefits of total hip arthroplasty were presented and reviewed. The risks due to aseptic loosening, infection, stiffness, dislocation/subluxation,  thromboembolic complications and other imponderables were discussed.  The patient acknowledged the explanation, agreed to proceed with the plan and consent was signed. Patient is being admitted for inpatient treatment for surgery, pain control,  PT, OT, prophylactic antibiotics, VTE prophylaxis, progressive ambulation and ADL's and discharge planning. The patient is planning to be discharged home with home health services

## 2015-08-24 DIAGNOSIS — Z96641 Presence of right artificial hip joint: Secondary | ICD-10-CM

## 2015-08-24 DIAGNOSIS — T849XXA Unspecified complication of internal orthopedic prosthetic device, implant and graft, initial encounter: Secondary | ICD-10-CM | POA: Diagnosis present

## 2015-08-24 MED ORDER — CHLORHEXIDINE GLUCONATE 4 % EX LIQD: 60.0000 mL | Freq: Once | CUTANEOUS | Status: DC

## 2015-08-24 MED ORDER — VANCOMYCIN HCL IN DEXTROSE 1-5 GM/200ML-% IV SOLN
1000.0000 mg | INTRAVENOUS | Status: AC
Start: 2015-08-25 — End: 2015-08-25
  Administered 2015-08-25: 1000 mg via INTRAVENOUS
  Filled 2015-08-24: qty 200

## 2015-08-24 MED ORDER — DEXTROSE-NACL 5-0.45 % IV SOLN: INTRAVENOUS | Status: DC

## 2015-08-25 ENCOUNTER — Inpatient Hospital Stay (HOSPITAL_COMMUNITY): Payer: 59 | Admitting: Anesthesiology

## 2015-08-25 ENCOUNTER — Encounter (HOSPITAL_COMMUNITY)
Admission: RE | Disposition: A | Discharge status: Home Health | Payer: 59 | Source: Ambulatory Visit | Attending: Internal Medicine

## 2015-08-25 ENCOUNTER — Inpatient Hospital Stay (HOSPITAL_COMMUNITY): Payer: 59

## 2015-08-25 ENCOUNTER — Encounter (HOSPITAL_COMMUNITY): Payer: Self-pay | Admitting: Anesthesiology

## 2015-08-25 ENCOUNTER — Inpatient Hospital Stay (HOSPITAL_COMMUNITY)
Admission: RE | Admit: 2015-08-25 | Discharge: 2015-08-27 | DRG: 466 | Disposition: A | Discharge status: Home Health | Payer: 59 | Source: Ambulatory Visit | Attending: Orthopedic Surgery | Admitting: Orthopedic Surgery

## 2015-08-25 DIAGNOSIS — Z96641 Presence of right artificial hip joint: Secondary | ICD-10-CM | POA: Diagnosis not present

## 2015-08-25 DIAGNOSIS — H04129 Dry eye syndrome of unspecified lacrimal gland: Secondary | ICD-10-CM | POA: Diagnosis present

## 2015-08-25 DIAGNOSIS — M1611 Unilateral primary osteoarthritis, right hip: Secondary | ICD-10-CM

## 2015-08-25 DIAGNOSIS — E785 Hyperlipidemia, unspecified: Secondary | ICD-10-CM | POA: Diagnosis present

## 2015-08-25 DIAGNOSIS — K219 Gastro-esophageal reflux disease without esophagitis: Secondary | ICD-10-CM | POA: Diagnosis present

## 2015-08-25 DIAGNOSIS — Z981 Arthrodesis status: Secondary | ICD-10-CM

## 2015-08-25 DIAGNOSIS — I469 Cardiac arrest, cause unspecified: Secondary | ICD-10-CM | POA: Diagnosis not present

## 2015-08-25 DIAGNOSIS — D72829 Elevated white blood cell count, unspecified: Secondary | ICD-10-CM | POA: Diagnosis not present

## 2015-08-25 DIAGNOSIS — G43909 Migraine, unspecified, not intractable, without status migrainosus: Secondary | ICD-10-CM | POA: Diagnosis present

## 2015-08-25 DIAGNOSIS — E872 Acidosis: Secondary | ICD-10-CM | POA: Diagnosis not present

## 2015-08-25 DIAGNOSIS — G4733 Obstructive sleep apnea (adult) (pediatric): Secondary | ICD-10-CM | POA: Diagnosis present

## 2015-08-25 DIAGNOSIS — Y793 Surgical instruments, materials and orthopedic devices (including sutures) associated with adverse incidents: Secondary | ICD-10-CM | POA: Diagnosis present

## 2015-08-25 DIAGNOSIS — R0782 Intercostal pain: Secondary | ICD-10-CM | POA: Diagnosis not present

## 2015-08-25 DIAGNOSIS — T40605A Adverse effect of unspecified narcotics, initial encounter: Secondary | ICD-10-CM | POA: Diagnosis not present

## 2015-08-25 DIAGNOSIS — M25551 Pain in right hip: Secondary | ICD-10-CM | POA: Diagnosis present

## 2015-08-25 DIAGNOSIS — T849XXA Unspecified complication of internal orthopedic prosthetic device, implant and graft, initial encounter: Secondary | ICD-10-CM | POA: Diagnosis present

## 2015-08-25 DIAGNOSIS — T8489XA Other specified complication of internal orthopedic prosthetic devices, implants and grafts, initial encounter: Secondary | ICD-10-CM | POA: Diagnosis present

## 2015-08-25 DIAGNOSIS — R079 Chest pain, unspecified: Secondary | ICD-10-CM | POA: Insufficient documentation

## 2015-08-25 DIAGNOSIS — G894 Chronic pain syndrome: Secondary | ICD-10-CM | POA: Diagnosis present

## 2015-08-25 DIAGNOSIS — M9701XA Periprosthetic fracture around internal prosthetic right hip joint, initial encounter: Secondary | ICD-10-CM | POA: Diagnosis present

## 2015-08-25 DIAGNOSIS — I209 Angina pectoris, unspecified: Secondary | ICD-10-CM

## 2015-08-25 DIAGNOSIS — F329 Major depressive disorder, single episode, unspecified: Secondary | ICD-10-CM | POA: Diagnosis present

## 2015-08-25 DIAGNOSIS — D62 Acute posthemorrhagic anemia: Secondary | ICD-10-CM | POA: Diagnosis not present

## 2015-08-25 DIAGNOSIS — T84010A Broken internal right hip prosthesis, initial encounter: Secondary | ICD-10-CM

## 2015-08-25 DIAGNOSIS — Z96649 Presence of unspecified artificial hip joint: Secondary | ICD-10-CM | POA: Insufficient documentation

## 2015-08-25 DIAGNOSIS — F419 Anxiety disorder, unspecified: Secondary | ICD-10-CM | POA: Diagnosis present

## 2015-08-25 DIAGNOSIS — R000 Tachycardia, unspecified: Secondary | ICD-10-CM | POA: Diagnosis not present

## 2015-08-25 DIAGNOSIS — J45909 Unspecified asthma, uncomplicated: Secondary | ICD-10-CM | POA: Diagnosis present

## 2015-08-25 HISTORY — PX: TOTAL HIP REVISION: SHX763

## 2015-08-25 LAB — POCT I-STAT 3, ART BLOOD GAS (G3+)
ACID-BASE DEFICIT: 9 mmol/L — AB (ref 0.0–2.0)
BICARBONATE: 18.1 meq/L — AB (ref 20.0–24.0)
O2 Saturation: 97 %
PCO2 ART: 41.6 mmHg (ref 35.0–45.0)
PO2 ART: 107 mmHg — AB (ref 80.0–100.0)
Patient temperature: 97.9
TCO2: 19 mmol/L (ref 0–100)
pH, Arterial: 7.244 — ABNORMAL LOW (ref 7.350–7.450)

## 2015-08-25 LAB — CBC
HCT: 43 % (ref 36.0–46.0)
Hemoglobin: 13.7 g/dL (ref 12.0–15.0)
MCH: 31 pg (ref 26.0–34.0)
MCHC: 31.9 g/dL (ref 30.0–36.0)
MCV: 97.3 fL (ref 78.0–100.0)
Platelets: 224 10*3/uL (ref 150–400)
RBC: 4.42 MIL/uL (ref 3.87–5.11)
RDW: 15.7 % — AB (ref 11.5–15.5)
WBC: 17.4 10*3/uL — ABNORMAL HIGH (ref 4.0–10.5)

## 2015-08-25 LAB — TROPONIN I: Troponin I: 0.16 ng/mL — ABNORMAL HIGH (ref <0.031)

## 2015-08-25 LAB — BASIC METABOLIC PANEL
Anion gap: 15 (ref 5–15)
BUN: 17 mg/dL (ref 6–20)
CALCIUM: 8.2 mg/dL — AB (ref 8.9–10.3)
CO2: 18 mmol/L — AB (ref 22–32)
CREATININE: 1.07 mg/dL — AB (ref 0.44–1.00)
Chloride: 103 mmol/L (ref 101–111)
GFR calc non Af Amer: 58 mL/min — ABNORMAL LOW (ref >60)
Glucose, Bld: 446 mg/dL — ABNORMAL HIGH (ref 65–99)
Potassium: 4.5 mmol/L (ref 3.5–5.1)
SODIUM: 136 mmol/L (ref 135–145)

## 2015-08-25 LAB — LACTIC ACID, PLASMA
LACTIC ACID, VENOUS: 5.3 mmol/L — AB (ref 0.5–2.0)
LACTIC ACID, VENOUS: 3.2 mmol/L — AB (ref 0.5–2.0)

## 2015-08-25 LAB — MAGNESIUM: MAGNESIUM: 2.3 mg/dL (ref 1.7–2.4)

## 2015-08-25 LAB — MRSA PCR SCREENING: MRSA by PCR: NEGATIVE

## 2015-08-25 SURGERY — TOTAL HIP REVISION
Anesthesia: General | Site: Hip | Laterality: Right

## 2015-08-25 MED ORDER — NALOXONE HCL 2 MG/2ML IJ SOSY: 0.2500 mg/h | PREFILLED_SYRINGE | INTRAVENOUS | Status: DC

## 2015-08-25 MED ORDER — HYDROMORPHONE HCL 1 MG/ML IJ SOLN
INTRAMUSCULAR | Status: AC
  Filled 2015-08-25: qty 1

## 2015-08-25 MED ORDER — SODIUM CHLORIDE 0.9 % IV SOLN
INTRAVENOUS | Status: DC | PRN
  Administered 2015-08-25: 12:00:00 via INTRAVENOUS

## 2015-08-25 MED ORDER — DEXAMETHASONE SODIUM PHOSPHATE 4 MG/ML IJ SOLN
INTRAMUSCULAR | Status: DC | PRN
  Administered 2015-08-25: 8 mg via INTRAVENOUS

## 2015-08-25 MED ORDER — LACTATED RINGERS IV SOLN
INTRAVENOUS | Status: DC
  Administered 2015-08-25 (×2): via INTRAVENOUS

## 2015-08-25 MED ORDER — PANTOPRAZOLE SODIUM 40 MG PO TBEC
40.0000 mg | DELAYED_RELEASE_TABLET | Freq: Every day | ORAL | Status: DC
  Administered 2015-08-26 – 2015-08-27 (×2): 40 mg via ORAL
  Filled 2015-08-25 (×2): qty 1

## 2015-08-25 MED ORDER — ROCURONIUM BROMIDE 50 MG/5ML IV SOLN
INTRAVENOUS | Status: AC
  Filled 2015-08-25: qty 1

## 2015-08-25 MED ORDER — ONDANSETRON HCL 4 MG/2ML IJ SOLN
INTRAMUSCULAR | Status: DC | PRN
  Administered 2015-08-25: 4 mg via INTRAVENOUS

## 2015-08-25 MED ORDER — KCL IN DEXTROSE-NACL 20-5-0.45 MEQ/L-%-% IV SOLN
INTRAVENOUS | Status: DC
  Administered 2015-08-25 (×2): via INTRAVENOUS
  Filled 2015-08-25 (×3): qty 1000

## 2015-08-25 MED ORDER — METHOCARBAMOL 500 MG PO TABS
500.0000 mg | ORAL_TABLET | Freq: Four times a day (QID) | ORAL | Status: DC | PRN
Start: 2015-08-25 — End: 2015-08-27
  Administered 2015-08-27: 500 mg via ORAL
  Filled 2015-08-25: qty 1

## 2015-08-25 MED ORDER — ROCURONIUM BROMIDE 100 MG/10ML IV SOLN
INTRAVENOUS | Status: DC | PRN
  Administered 2015-08-25: 30 mg via INTRAVENOUS
  Administered 2015-08-25: 20 mg via INTRAVENOUS

## 2015-08-25 MED ORDER — QUETIAPINE FUMARATE ER 50 MG PO TB24
150.0000 mg | ORAL_TABLET | Freq: Every day | ORAL | Status: DC
  Filled 2015-08-25 (×3): qty 3

## 2015-08-25 MED ORDER — TRANEXAMIC ACID 1000 MG/10ML IV SOLN
2000.0000 mg | INTRAVENOUS | Status: DC
  Filled 2015-08-25: qty 20

## 2015-08-25 MED ORDER — HYDROMORPHONE HCL 1 MG/ML IJ SOLN: 1.0000 mg | INTRAMUSCULAR | Status: DC | PRN

## 2015-08-25 MED ORDER — NALOXONE HCL 2 MG/2ML IJ SOSY
0.5000 mg/h | PREFILLED_SYRINGE | INTRAVENOUS | Status: DC
  Administered 2015-08-25: 0.5 mg/h via INTRAVENOUS
  Filled 2015-08-25: qty 4

## 2015-08-25 MED ORDER — DEXAMETHASONE SODIUM PHOSPHATE 10 MG/ML IJ SOLN
10.0000 mg | Freq: Once | INTRAMUSCULAR | Status: DC
  Filled 2015-08-25: qty 1

## 2015-08-25 MED ORDER — NEOSTIGMINE METHYLSULFATE 10 MG/10ML IV SOLN
INTRAVENOUS | Status: DC | PRN
  Administered 2015-08-25: 3 mg via INTRAVENOUS

## 2015-08-25 MED ORDER — LIDOCAINE HCL 4 % EX SOLN
CUTANEOUS | Status: DC | PRN
  Administered 2015-08-25: 4 mL via TOPICAL

## 2015-08-25 MED ORDER — HYDROMORPHONE HCL 1 MG/ML IJ SOLN
INTRAMUSCULAR | Status: AC
  Filled 2015-08-25: qty 2

## 2015-08-25 MED ORDER — PROPOFOL 10 MG/ML IV BOLUS
INTRAVENOUS | Status: AC
  Filled 2015-08-25: qty 20

## 2015-08-25 MED ORDER — ASPIRIN EC 325 MG PO TBEC: 325.0000 mg | DELAYED_RELEASE_TABLET | Freq: Two times a day (BID) | ORAL | Status: DC

## 2015-08-25 MED ORDER — GLYCOPYRROLATE 0.2 MG/ML IJ SOLN
INTRAMUSCULAR | Status: DC | PRN
  Administered 2015-08-25: 0.4 mg via INTRAVENOUS

## 2015-08-25 MED ORDER — SODIUM CHLORIDE 0.9 % IV SOLN
INTRAVENOUS | Status: DC
  Administered 2015-08-25 – 2015-08-26 (×2): via INTRAVENOUS

## 2015-08-25 MED ORDER — PROPOFOL 10 MG/ML IV BOLUS
INTRAVENOUS | Status: DC | PRN
  Administered 2015-08-25: 180 mg via INTRAVENOUS

## 2015-08-25 MED ORDER — ACETAMINOPHEN 325 MG PO TABS
650.0000 mg | ORAL_TABLET | Freq: Four times a day (QID) | ORAL | Status: DC | PRN
  Administered 2015-08-25: 650 mg via ORAL
  Filled 2015-08-25: qty 2

## 2015-08-25 MED ORDER — METOCLOPRAMIDE HCL 5 MG/ML IJ SOLN: 5.0000 mg | Freq: Three times a day (TID) | INTRAMUSCULAR | Status: DC | PRN

## 2015-08-25 MED ORDER — ASPIRIN EC 325 MG PO TBEC
325.0000 mg | DELAYED_RELEASE_TABLET | Freq: Every day | ORAL | Status: DC
  Administered 2015-08-26 – 2015-08-27 (×2): 325 mg via ORAL
  Filled 2015-08-25 (×2): qty 1

## 2015-08-25 MED ORDER — HYDROCODONE-ACETAMINOPHEN 5-325 MG PO TABS
ORAL_TABLET | ORAL | Status: AC
  Filled 2015-08-25: qty 4

## 2015-08-25 MED ORDER — BISACODYL 5 MG PO TBEC: 5.0000 mg | DELAYED_RELEASE_TABLET | Freq: Every day | ORAL | Status: DC | PRN

## 2015-08-25 MED ORDER — MIDAZOLAM HCL 2 MG/2ML IJ SOLN
INTRAMUSCULAR | Status: AC
  Filled 2015-08-25: qty 2

## 2015-08-25 MED ORDER — HYDROMORPHONE HCL 1 MG/ML IJ SOLN
INTRAMUSCULAR | Status: DC | PRN
  Administered 2015-08-25 (×4): 0.5 mg via INTRAVENOUS

## 2015-08-25 MED ORDER — ONDANSETRON HCL 4 MG/2ML IJ SOLN
INTRAMUSCULAR | Status: AC
  Filled 2015-08-25: qty 2

## 2015-08-25 MED ORDER — SENNOSIDES-DOCUSATE SODIUM 8.6-50 MG PO TABS: 1.0000 | ORAL_TABLET | Freq: Every evening | ORAL | Status: DC | PRN

## 2015-08-25 MED ORDER — KCL IN DEXTROSE-NACL 20-5-0.45 MEQ/L-%-% IV SOLN
INTRAVENOUS | Status: AC
  Administered 2015-08-25: 1000 mL
  Filled 2015-08-25: qty 1000

## 2015-08-25 MED ORDER — ALUM & MAG HYDROXIDE-SIMETH 200-200-20 MG/5ML PO SUSP
30.0000 mL | ORAL | Status: DC | PRN
  Administered 2015-08-26: 30 mL via ORAL
  Filled 2015-08-25: qty 30

## 2015-08-25 MED ORDER — METHOCARBAMOL 1000 MG/10ML IJ SOLN
500.0000 mg | Freq: Four times a day (QID) | INTRAVENOUS | Status: DC | PRN
  Filled 2015-08-25: qty 5

## 2015-08-25 MED ORDER — ONDANSETRON HCL 4 MG PO TABS: 4.0000 mg | ORAL_TABLET | Freq: Four times a day (QID) | ORAL | Status: DC | PRN

## 2015-08-25 MED ORDER — MENTHOL 3 MG MT LOZG: 1.0000 | LOZENGE | OROMUCOSAL | Status: DC | PRN

## 2015-08-25 MED ORDER — PHENYLEPHRINE HCL 10 MG/ML IJ SOLN
INTRAMUSCULAR | Status: DC | PRN
  Administered 2015-08-25 (×2): 80 ug via INTRAVENOUS
  Administered 2015-08-25: 120 ug via INTRAVENOUS
  Administered 2015-08-25: 40 ug via INTRAVENOUS
  Administered 2015-08-25 (×3): 80 ug via INTRAVENOUS

## 2015-08-25 MED ORDER — BUPIVACAINE-EPINEPHRINE (PF) 0.5% -1:200000 IJ SOLN
INTRAMUSCULAR | Status: AC
  Filled 2015-08-25: qty 30

## 2015-08-25 MED ORDER — HYDROMORPHONE HCL 1 MG/ML IJ SOLN
0.5000 mg | INTRAMUSCULAR | Status: AC | PRN
  Administered 2015-08-25 (×4): 0.5 mg via INTRAVENOUS

## 2015-08-25 MED ORDER — FENTANYL CITRATE (PF) 100 MCG/2ML IJ SOLN
INTRAMUSCULAR | Status: DC | PRN
  Administered 2015-08-25 (×2): 50 ug via INTRAVENOUS
  Administered 2015-08-25: 100 ug via INTRAVENOUS
  Administered 2015-08-25: 50 ug via INTRAVENOUS

## 2015-08-25 MED ORDER — LIDOCAINE HCL (CARDIAC) 20 MG/ML IV SOLN
INTRAVENOUS | Status: AC
  Filled 2015-08-25: qty 5

## 2015-08-25 MED ORDER — METHOCARBAMOL 1000 MG/10ML IJ SOLN
500.0000 mg | INTRAVENOUS | Status: AC
  Administered 2015-08-25: 500 mg via INTRAVENOUS
  Filled 2015-08-25: qty 5

## 2015-08-25 MED ORDER — HYDROCODONE-ACETAMINOPHEN 10-325 MG PO TABS
1.0000 | ORAL_TABLET | ORAL | Status: DC | PRN
  Administered 2015-08-25: 2 via ORAL
  Administered 2015-08-26 (×4): 1 via ORAL
  Administered 2015-08-26 – 2015-08-27 (×3): 2 via ORAL
  Administered 2015-08-27: 1 via ORAL
  Filled 2015-08-25 (×2): qty 1
  Filled 2015-08-25 (×2): qty 2
  Filled 2015-08-25: qty 1
  Filled 2015-08-25 (×3): qty 2

## 2015-08-25 MED ORDER — LIDOCAINE HCL (CARDIAC) 20 MG/ML IV SOLN
INTRAVENOUS | Status: DC | PRN
  Administered 2015-08-25: 100 mg via INTRAVENOUS

## 2015-08-25 MED ORDER — PROMETHAZINE HCL 25 MG/ML IJ SOLN
12.5000 mg | Freq: Four times a day (QID) | INTRAMUSCULAR | Status: DC | PRN
  Administered 2015-08-25 (×2): 12.5 mg via INTRAVENOUS
  Filled 2015-08-25 (×2): qty 1

## 2015-08-25 MED ORDER — FLEET ENEMA 7-19 GM/118ML RE ENEM
1.0000 | ENEMA | Freq: Once | RECTAL | Status: DC | PRN
  Filled 2015-08-25: qty 1

## 2015-08-25 MED ORDER — ONDANSETRON HCL 4 MG/2ML IJ SOLN
4.0000 mg | Freq: Four times a day (QID) | INTRAMUSCULAR | Status: DC | PRN
  Administered 2015-08-25: 4 mg via INTRAVENOUS
  Filled 2015-08-25 (×2): qty 2

## 2015-08-25 MED ORDER — KETOROLAC TROMETHAMINE 30 MG/ML IJ SOLN
30.0000 mg | Freq: Four times a day (QID) | INTRAMUSCULAR | Status: DC | PRN
  Administered 2015-08-25 – 2015-08-26 (×3): 30 mg via INTRAVENOUS
  Filled 2015-08-25 (×3): qty 1

## 2015-08-25 MED ORDER — DIPHENHYDRAMINE HCL 12.5 MG/5ML PO ELIX
12.5000 mg | ORAL_SOLUTION | ORAL | Status: DC | PRN
  Filled 2015-08-25: qty 10

## 2015-08-25 MED ORDER — HYDROMORPHONE HCL 1 MG/ML IJ SOLN: 0.2500 mg | INTRAMUSCULAR | Status: DC | PRN

## 2015-08-25 MED ORDER — NALOXONE HCL 0.4 MG/ML IJ SOLN
INTRAMUSCULAR | Status: AC
  Filled 2015-08-25: qty 1

## 2015-08-25 MED ORDER — DEXAMETHASONE SODIUM PHOSPHATE 4 MG/ML IJ SOLN
INTRAMUSCULAR | Status: AC
  Filled 2015-08-25: qty 2

## 2015-08-25 MED ORDER — MIDAZOLAM HCL 5 MG/5ML IJ SOLN
INTRAMUSCULAR | Status: DC | PRN
  Administered 2015-08-25 (×2): 0.5 mg via INTRAVENOUS

## 2015-08-25 MED ORDER — SODIUM CHLORIDE 0.9 % IV BOLUS (SEPSIS)
1000.0000 mL | Freq: Once | INTRAVENOUS | Status: AC
  Administered 2015-08-25: 1000 mL via INTRAVENOUS

## 2015-08-25 MED ORDER — PHENOL 1.4 % MT LIQD: 1.0000 | OROMUCOSAL | Status: DC | PRN

## 2015-08-25 MED ORDER — TRANEXAMIC ACID 1000 MG/10ML IV SOLN
1000.0000 mg | INTRAVENOUS | Status: AC
Start: 2015-08-25 — End: 2015-08-25
  Administered 2015-08-25: 1000 mg via INTRAVENOUS
  Filled 2015-08-25: qty 10

## 2015-08-25 MED ORDER — ACETAMINOPHEN 650 MG RE SUPP: 650.0000 mg | Freq: Four times a day (QID) | RECTAL | Status: DC | PRN

## 2015-08-25 MED ORDER — CEFUROXIME SODIUM 1.5 G IJ SOLR
INTRAMUSCULAR | Status: AC
  Filled 2015-08-25: qty 1.5

## 2015-08-25 MED ORDER — FENTANYL CITRATE (PF) 250 MCG/5ML IJ SOLN
INTRAMUSCULAR | Status: AC
  Filled 2015-08-25: qty 5

## 2015-08-25 MED ORDER — METOCLOPRAMIDE HCL 5 MG PO TABS
5.0000 mg | ORAL_TABLET | Freq: Three times a day (TID) | ORAL | Status: DC | PRN
  Filled 2015-08-25: qty 2

## 2015-08-25 MED ORDER — HYDROCODONE-ACETAMINOPHEN 10-325 MG PO TABS: 1.0000 | ORAL_TABLET | Freq: Four times a day (QID) | ORAL | Status: DC | PRN

## 2015-08-25 MED ORDER — ONDANSETRON HCL 4 MG/2ML IJ SOLN: 4.0000 mg | Freq: Once | INTRAMUSCULAR | Status: DC | PRN

## 2015-08-25 MED ORDER — HYDROMORPHONE HCL 1 MG/ML IJ SOLN
0.5000 mg | INTRAMUSCULAR | Status: DC | PRN
  Administered 2015-08-25 (×6): 0.5 mg via INTRAVENOUS

## 2015-08-25 MED ORDER — PHENYLEPHRINE 40 MCG/ML (10ML) SYRINGE FOR IV PUSH (FOR BLOOD PRESSURE SUPPORT)
PREFILLED_SYRINGE | INTRAVENOUS | Status: AC
  Filled 2015-08-25: qty 30

## 2015-08-25 MED ORDER — DOCUSATE SODIUM 100 MG PO CAPS
100.0000 mg | ORAL_CAPSULE | Freq: Two times a day (BID) | ORAL | Status: DC
  Administered 2015-08-25 – 2015-08-27 (×4): 100 mg via ORAL
  Filled 2015-08-25 (×4): qty 1

## 2015-08-25 MED ORDER — LORATADINE 10 MG PO TABS
10.0000 mg | ORAL_TABLET | Freq: Every day | ORAL | Status: DC
  Administered 2015-08-26 – 2015-08-27 (×2): 10 mg via ORAL
  Filled 2015-08-25 (×2): qty 1

## 2015-08-25 SURGICAL SUPPLY — 69 items
BLADE SAW SAG 73X25 THK (BLADE) ×2
BLADE SAW SGTL 73X25 THK (BLADE) IMPLANT
BONE CANC CHIPS 20CC PCAN1/4 (Bone Implant) ×3 IMPLANT
BRUSH FEMORAL CANAL (MISCELLANEOUS) IMPLANT
CHIPS CANC BONE 20CC PCAN1/4 (Bone Implant) ×1 IMPLANT
COVER SURGICAL LIGHT HANDLE (MISCELLANEOUS) ×3 IMPLANT
DEVICE TROCH REATTACH 4 CABLE (Orthopedic Implant) ×2 IMPLANT
DRAPE C-ARM 42X72 X-RAY (DRAPES) IMPLANT
DRAPE IMP U-DRAPE 54X76 (DRAPES) ×3 IMPLANT
DRAPE ORTHO SPLIT 77X108 STRL (DRAPES) ×3
DRAPE PROXIMA HALF (DRAPES) ×3 IMPLANT
DRAPE SURG ORHT 6 SPLT 77X108 (DRAPES) ×1 IMPLANT
DRAPE U-SHAPE 47X51 STRL (DRAPES) ×3 IMPLANT
DRILL BIT 7/64X5 (BIT) ×3 IMPLANT
DRSG AQUACEL AG ADV 3.5X10 (GAUZE/BANDAGES/DRESSINGS) ×3 IMPLANT
DRSG AQUACEL AG ADV 3.5X14 (GAUZE/BANDAGES/DRESSINGS) ×2 IMPLANT
DURAPREP 26ML APPLICATOR (WOUND CARE) ×5 IMPLANT
ELECT BLADE 4.0 EZ CLEAN MEGAD (MISCELLANEOUS)
ELECT BLADE 6.5 EXT (BLADE) IMPLANT
ELECT REM PT RETURN 9FT ADLT (ELECTROSURGICAL) ×3
ELECTRODE BLDE 4.0 EZ CLN MEGD (MISCELLANEOUS) IMPLANT
ELECTRODE REM PT RTRN 9FT ADLT (ELECTROSURGICAL) ×1 IMPLANT
EVACUATOR 1/8 PVC DRAIN (DRAIN) IMPLANT
GAUZE SPONGE 4X4 12PLY STRL (GAUZE/BANDAGES/DRESSINGS) ×3 IMPLANT
GAUZE XEROFORM 5X9 LF (GAUZE/BANDAGES/DRESSINGS) ×3 IMPLANT
GLOVE BIO SURGEON STRL SZ7.5 (GLOVE) ×3 IMPLANT
GLOVE BIO SURGEON STRL SZ8.5 (GLOVE) ×6 IMPLANT
GLOVE BIOGEL PI IND STRL 8 (GLOVE) ×2 IMPLANT
GLOVE BIOGEL PI IND STRL 9 (GLOVE) ×1 IMPLANT
GLOVE BIOGEL PI INDICATOR 8 (GLOVE) ×4
GLOVE BIOGEL PI INDICATOR 9 (GLOVE) ×2
GOWN STRL REUS W/ TWL LRG LVL3 (GOWN DISPOSABLE) ×1 IMPLANT
GOWN STRL REUS W/ TWL XL LVL3 (GOWN DISPOSABLE) ×2 IMPLANT
GOWN STRL REUS W/TWL LRG LVL3 (GOWN DISPOSABLE) ×6
GOWN STRL REUS W/TWL XL LVL3 (GOWN DISPOSABLE) ×6
GRAFT BNE CANC CHIPS 1-8 20CC (Bone Implant) IMPLANT
HANDPIECE INTERPULSE COAX TIP (DISPOSABLE)
HEAD CERAMIC BIOLOX 36 +3 (Hips) ×2 IMPLANT
HOOD PEEL AWAY FACE SHEILD DIS (HOOD) ×8 IMPLANT
KIT BASIN OR (CUSTOM PROCEDURE TRAY) ×3 IMPLANT
KIT ROOM TURNOVER OR (KITS) ×3 IMPLANT
LINER NEUTRAL 52X36MM PLUS 4 (Liner) ×2 IMPLANT
MANIFOLD NEPTUNE II (INSTRUMENTS) ×3 IMPLANT
NEEDLE 22X1 1/2 (OR ONLY) (NEEDLE) ×3 IMPLANT
NS IRRIG 1000ML POUR BTL (IV SOLUTION) ×6 IMPLANT
PACK TOTAL JOINT (CUSTOM PROCEDURE TRAY) ×3 IMPLANT
PACK UNIVERSAL I (CUSTOM PROCEDURE TRAY) ×3 IMPLANT
PAD ARMBOARD 7.5X6 YLW CONV (MISCELLANEOUS) ×6 IMPLANT
PASSER SUT SWANSON 36MM LOOP (INSTRUMENTS) IMPLANT
PRESSURIZER FEMORAL UNIV (MISCELLANEOUS) IMPLANT
SET HNDPC FAN SPRY TIP SCT (DISPOSABLE) IMPLANT
SLEEVE SURGEON STRL (DRAPES) IMPLANT
SPONGE LAP 18X18 X RAY DECT (DISPOSABLE) IMPLANT
STAPLER VISISTAT 35W (STAPLE) ×3 IMPLANT
SUT ETHIBOND 2 V 37 (SUTURE) ×3 IMPLANT
SUT VIC AB 0 CTB1 27 (SUTURE) ×3 IMPLANT
SUT VIC AB 1 CTX 36 (SUTURE) ×6
SUT VIC AB 1 CTX36XBRD ANBCTR (SUTURE) ×1 IMPLANT
SUT VIC AB 2-0 CTB1 (SUTURE) ×3 IMPLANT
SUT VIC AB 3-0 CT1 27 (SUTURE) ×3
SUT VIC AB 3-0 CT1 TAPERPNT 27 (SUTURE) ×1 IMPLANT
SYR 20ML ECCENTRIC (SYRINGE) ×3 IMPLANT
SYR CONTROL 10ML LL (SYRINGE) ×3 IMPLANT
TOWEL OR 17X24 6PK STRL BLUE (TOWEL DISPOSABLE) ×3 IMPLANT
TOWEL OR 17X26 10 PK STRL BLUE (TOWEL DISPOSABLE) ×3 IMPLANT
TOWER CARTRIDGE SMART MIX (DISPOSABLE) IMPLANT
TRAY FOLEY CATH 14FR (SET/KITS/TRAYS/PACK) IMPLANT
TUBE ANAEROBIC SPECIMEN COL (MISCELLANEOUS) ×3 IMPLANT
WATER STERILE IRR 1000ML POUR (IV SOLUTION) ×9 IMPLANT

## 2015-08-25 NOTE — Interval H&P Note (Signed)
History and Physical Interval Note:  08/25/2015 7:44 AM  Alicia Dunn  has presented today for surgery, with the diagnosis of PAINFUL RIGHT TOTAL HIP ARTHROPLASTY  The various methods of treatment have been discussed with the patient and family. After consideration of risks, benefits and other options for treatment, the patient has consented to  Procedure(s): RIGHT TOTAL HIP REVISION (Right) as a surgical intervention .  The patient's history has been reviewed, patient examined, no change in status, stable for surgery.  I have reviewed the patient's chart and labs.  Questions were answered to the patient's satisfaction.     Kerin Salen

## 2015-08-25 NOTE — Progress Notes (Addendum)
ABG results given To RN, paging MD now.  - per MD (post ABG results), no new RT orders at this time except cpap/bipap (per RT) QHS.

## 2015-08-25 NOTE — Addendum Note (Signed)
Addendum  created 08/25/15 1647 by Lillia Abed, MD   Modules edited: Clinical Notes   Clinical Notes:  File: [id number]; [place]: [id number]

## 2015-08-25 NOTE — Anesthesia Preprocedure Evaluation (Signed)
Anesthesia Evaluation  Patient identified by MRN, date of birth, ID band Patient awake    Reviewed: Allergy & Precautions, NPO status , Patient's Chart, lab work & pertinent test results  Airway Mallampati: I       Dental   Pulmonary asthma ,    Pulmonary exam normal        Cardiovascular Normal cardiovascular exam+ dysrhythmias      Neuro/Psych  Headaches, Anxiety Depression    GI/Hepatic GERD  ,  Endo/Other    Renal/GU Renal disease     Musculoskeletal  (+) Arthritis ,   Abdominal   Peds  Hematology   Anesthesia Other Findings Back Fusion  Reproductive/Obstetrics                             Anesthesia Physical Anesthesia Plan  ASA: II  Anesthesia Plan: General   Post-op Pain Management:    Induction: Intravenous  Airway Management Planned: Oral ETT  Additional Equipment:   Intra-op Plan:   Post-operative Plan: Extubation in OR  Informed Consent: I have reviewed the patients History and Physical, chart, labs and discussed the procedure including the risks, benefits and alternatives for the proposed anesthesia with the patient or authorized representative who has indicated his/her understanding and acceptance.     Plan Discussed with: Anesthesiologist, CRNA and Surgeon  Anesthesia Plan Comments:         Anesthesia Quick Evaluation

## 2015-08-25 NOTE — Discharge Instructions (Signed)
INSTRUCTIONS AFTER JOINT REPLACEMENT   o Remove items at home which could result in a fall. This includes throw rugs or furniture in walking pathways o ICE to the affected joint every three hours while awake for 30 minutes at a time, for at least the first 3-5 days, and then as needed for pain and swelling.  Continue to use ice for pain and swelling. You may notice swelling that will progress down to the foot and ankle.  This is normal after surgery.  Elevate your leg when you are not up walking on it.   o Continue to use the breathing machine you got in the hospital (incentive spirometer) which will help keep your temperature down.  It is common for your temperature to cycle up and down following surgery, especially at night when you are not up moving around and exerting yourself.  The breathing machine keeps your lungs expanded and your temperature down.   DIET:  As you were doing prior to hospitalization, we recommend a well-balanced diet.  DRESSING / WOUND CARE / SHOWERING  Keep the surgical dressing until follow up.  The dressing is water proof, so you can shower without any extra covering.  IF THE DRESSING FALLS OFF or the wound gets wet inside, change the dressing with sterile gauze.  Please use good hand washing techniques before changing the dressing.  Do not use any lotions or creams on the incision until instructed by your surgeon.    ACTIVITY  o Increase activity slowly as tolerated, but follow the weight bearing instructions below.   o No driving for 6 weeks or until further direction given by your physician.  You cannot drive while taking narcotics.  o No lifting or carrying greater than 10 lbs. until further directed by your surgeon. o Avoid periods of inactivity such as sitting longer than an hour when not asleep. This helps prevent blood clots.  o You may return to work once you are authorized by your doctor.     WEIGHT BEARING   Partial weight bearing with assist device as  directed.  50 % weight bearing   EXERCISES  Results after joint replacement surgery are often greatly improved when you follow the exercise, range of motion and muscle strengthening exercises prescribed by your doctor. Safety measures are also important to protect the joint from further injury. Any time any of these exercises cause you to have increased pain or swelling, decrease what you are doing until you are comfortable again and then slowly increase them. If you have problems or questions, call your caregiver or physical therapist for advice.   Rehabilitation is important following a joint replacement. After just a few days of immobilization, the muscles of the leg can become weakened and shrink (atrophy).  These exercises are designed to build up the tone and strength of the thigh and leg muscles and to improve motion. Often times heat used for twenty to thirty minutes before working out will loosen up your tissues and help with improving the range of motion but do not use heat for the first two weeks following surgery (sometimes heat can increase post-operative swelling).   These exercises can be done on a training (exercise) mat, on the floor, on a table or on a bed. Use whatever works the best and is most comfortable for you.    Use music or television while you are exercising so that the exercises are a pleasant break in your day. This will make your life better  with the exercises acting as a break in your routine that you can look forward to.   Perform all exercises about fifteen times, three times per day or as directed.  You should exercise both the operative leg and the other leg as well.  Exercises include:    Quad Sets - Tighten up the muscle on the front of the thigh (Quad) and hold for 5-10 seconds.    Straight Leg Raises - With your knee straight (if you were given a brace, keep it on), lift the leg to 60 degrees, hold for 3 seconds, and slowly lower the leg.  Perform this exercise  against resistance later as your leg gets stronger.   Leg Slides: Lying on your back, slowly slide your foot toward your buttocks, bending your knee up off the floor (only go as far as is comfortable). Then slowly slide your foot back down until your leg is flat on the floor again.   Angel Wings: Lying on your back spread your legs to the side as far apart as you can without causing discomfort.   Hamstring Strength:  Lying on your back, push your heel against the floor with your leg straight by tightening up the muscles of your buttocks.  Repeat, but this time bend your knee to a comfortable angle, and push your heel against the floor.  You may put a pillow under the heel to make it more comfortable if necessary.   A rehabilitation program following joint replacement surgery can speed recovery and prevent re-injury in the future due to weakened muscles. Contact your doctor or a physical therapist for more information on knee rehabilitation.    CONSTIPATION  Constipation is defined medically as fewer than three stools per week and severe constipation as less than one stool per week.  Even if you have a regular bowel pattern at home, your normal regimen is likely to be disrupted due to multiple reasons following surgery.  Combination of anesthesia, postoperative narcotics, change in appetite and fluid intake all can affect your bowels.   YOU MUST use at least one of the following options; they are listed in order of increasing strength to get the job done.  They are all available over the counter, and you may need to use some, POSSIBLY even all of these options:    Drink plenty of fluids (prune juice may be helpful) and high fiber foods Colace 100 mg by mouth twice a day  Senokot for constipation as directed and as needed Dulcolax (bisacodyl), take with full glass of water  Miralax (polyethylene glycol) once or twice a day as needed.  If you have tried all these things and are unable to have a  bowel movement in the first 3-4 days after surgery call either your surgeon or your primary doctor.    If you experience loose stools or diarrhea, hold the medications until you stool forms back up.  If your symptoms do not get better within 1 week or if they get worse, check with your doctor.  If you experience "the worst abdominal pain ever" or develop nausea or vomiting, please contact the office immediately for further recommendations for treatment.   ITCHING:  If you experience itching with your medications, try taking only a single pain pill, or even half a pain pill at a time.  You can also use Benadryl over the counter for itching or also to help with sleep.   TED HOSE STOCKINGS:  Use stockings on both legs until  for at least 2 weeks or as directed by physician office. They may be removed at night for sleeping.  MEDICATIONS:  See your medication summary on the After Visit Summary that nursing will review with you.  You may have some home medications which will be placed on hold until you complete the course of blood thinner medication.  It is important for you to complete the blood thinner medication as prescribed.  PRECAUTIONS:  If you experience chest pain or shortness of breath - call 911 immediately for transfer to the hospital emergency department.   If you develop a fever greater that 101 F, purulent drainage from wound, increased redness or drainage from wound, foul odor from the wound/dressing, or calf pain - CONTACT YOUR SURGEON.                                                   FOLLOW-UP APPOINTMENTS:  If you do not already have a post-op appointment, please call the office for an appointment to be seen by your surgeon.  Guidelines for how soon to be seen are listed in your After Visit Summary, but are typically between 1-4 weeks after surgery.  OTHER INSTRUCTIONS:   Knee Replacement:  Do not place pillow under knee, focus on keeping the knee straight while resting. CPM  instructions: 0-90 degrees, 2 hours in the morning, 2 hours in the afternoon, and 2 hours in the evening. Place foam block, curve side up under heel at all times except when in CPM or when walking.  DO NOT modify, tear, cut, or change the foam block in any way.  MAKE SURE YOU:   Understand these instructions.   Get help right away if you are not doing well or get worse.    Thank you for letting us be a part of your medical care team.  It is a privilege we respect greatly.  We hope these instructions will help you stay on track for a fast and full recovery!

## 2015-08-25 NOTE — Anesthesia Postprocedure Evaluation (Signed)
Anesthesia Post Note  Patient: Alicia Dunn  Procedure(s) Performed: Procedure(s) (LRB): RIGHT TOTAL HIP REVISION, ORIF PERI-PROSTHETIC FRACTURE (Right)  Patient location during evaluation: PACU Anesthesia Type: General Level of consciousness: awake, awake and alert and patient cooperative Pain management: pain level controlled Vital Signs Assessment: post-procedure vital signs reviewed and stable Respiratory status: spontaneous breathing and respiratory function stable Cardiovascular status: blood pressure returned to baseline and stable Anesthetic complications: no    Last Vitals:  Filed Vitals:   08/25/15 1230 08/25/15 1245  BP: 122/70 109/69  Pulse: 102 85  Temp:    Resp: 13 11    Last Pain:  Filed Vitals:   08/25/15 1252  PainSc: 10-Worst pain ever                 Dannika Hilgeman EDWARD

## 2015-08-25 NOTE — Significant Event (Signed)
Pt brought up from PACU  Report was being given to me when I asked if that was her snoring and she stated yes, when into room pt was agonal breathing, feet were blue, lips white with no response to sternal rub, name calling  Increased o2, pulse not found and started CPR, called RRT and code blue and team arrived Dr Fredirick Maudlin was in hallway and was asked to come into room, pt give epi and 2 narcan with a pulse noted, noted pt was responding to hand grips and her name.  Report called to Dr Mayer Camel and 2H-03 Clarise Cruz, and pt transfer to floor By RRT and myself, Family was escorted to waiting by charge nurse, daughter and parents. Dr Fredirick Maudlin spoke to Dr Mayer Camel regarding transfer to ICU. [name]

## 2015-08-25 NOTE — Transfer of Care (Signed)
Immediate Anesthesia Transfer of Care Note  Patient: Alicia Dunn  Procedure(s) Performed: Procedure(s): RIGHT TOTAL HIP REVISION, ORIF PERI-PROSTHETIC FRACTURE (Right)  Patient Location: PACU  Anesthesia Type:General  Level of Consciousness: awake, alert , oriented and patient cooperative  Airway & Oxygen Therapy: Patient Spontanous Breathing and Patient connected to nasal cannula oxygen  Post-op Assessment: Report given to RN and Post -op Vital signs reviewed and stable  Post vital signs: Reviewed and stable  Last Vitals:  Filed Vitals:   08/25/15 0718  BP: 130/78  Pulse: 98  Temp: 36.4 C  Resp: 20    Complications: No apparent anesthesia complications

## 2015-08-25 NOTE — Consult Note (Signed)
PULMONARY / CRITICAL CARE MEDICINE   Name: Alicia Dunn MRN: [id number] DOB: 1960-08-18    ADMISSION DATE:  08/25/2015 CONSULTATION DATE:  08/25/15  REFERRING MD:  Mayer Camel  CHIEF COMPLAINT:  Respiratory/PEA arrest  HISTORY OF PRESENT ILLNESS:   Alicia Dunn is a 55 y.o. female w/ PMHx of HLD, depression ,OSA not on CPAP, and OA, admitted to 5N after right total hip arthroplasty. Per chart review, patient received a total of 5 mg Dilaudid from 12:23-13:58 PM. When RN entered room on 5N, patient was found to have agonal breathing and pulseless. Not on a monitor at that time, therefore heart rhythm not assessed. Patient received 2 minutes of CPR with 1 amp Epinephrine. Given Narcan x2 with ROSC. Transferred to [facility] for ICU care, on NRB mask.    PAST MEDICAL HISTORY :  She  has a past medical history of ARTHRITIS; CARDIAC ARRHYTHMIA; DEPRESSION; HYPERLIPIDEMIA; RHINOPLASTY, HX OF; UTI'S, HX OF; Cervical stenosis of spinal canal; Family history of adverse reaction to anesthesia; ASTHMA; Anxiety; Kidney infection; GERD (gastroesophageal reflux disease); MIGRAINE HEADACHE; and Arthritis.  PAST SURGICAL HISTORY: She  has past surgical history that includes Spinal fusion (1986); Right Arthroscopic labral repair (07/2008); arthroscopic labral repair (Left, 11/2007); Open repair spontaneous dislocation hip (03/2008); Joint replacement (Right, 2011); and Rhinoplasty (1981).  Allergies  Allergen Reactions  . Augmentin [Amoxicillin-Pot Clavulanate] Rash and Nausea Only    + abdominal cramping   . Scopolamine Other (See Comments)    Other Reaction: Migraines  . Oxycodone-Acetaminophen Other (See Comments)    Other Reaction: hallucinations Hypersensitive, /w itching   . Iohexol      Desc: PREMED WITH BENADRYL, only oral contrast /w CAT, not MRI contrast    . Morphine     hallucinations  . Tramadol Other (See Comments)    Other Reaction: hallucinations, sweating    No current facility-administered  medications on file prior to encounter.   Current Outpatient Prescriptions on File Prior to Encounter  Medication Sig  . carisoprodol (SOMA) 350 MG tablet Take 1 tablet (350 mg total) by mouth at bedtime.  . meloxicam (MOBIC) 15 MG tablet Take 1 tablet (15 mg total) by mouth daily.  . Omega-3 Fatty Acids (FISH OIL) 1000 MG CAPS Take by mouth daily.    . pantoprazole (PROTONIX) 40 MG tablet Take 1 tablet (40 mg total) by mouth daily. (Patient taking differently: Take 40 mg by mouth daily before breakfast. )    FAMILY HISTORY:  Her has no family status information on file.   SOCIAL HISTORY: She  reports that she has never smoked. She does not have any smokeless tobacco history on file. She reports that she drinks alcohol. She reports that she does not use illicit drugs.  REVIEW OF SYSTEMS:   General: Positive for fatigue. Denies fever, chills, diaphoresis, appetite change.  Respiratory: Denies SOB, DOE, cough, chest tightness, and wheezing.   Cardiovascular: Positive for chest pain. Denies palpitations.  Gastrointestinal: Positive for nausea. Denies vomiting, abdominal pain, diarrhea, constipation, blood in stool and abdominal distention.  Genitourinary: Denies dysuria, urgency, frequency, hematuria, and flank pain. Endocrine: Denies hot or cold intolerance, polyuria, and polydipsia. Musculoskeletal: Positive for right hip pain. Denies myalgias, back pain, and gait problem.  Skin: Denies pallor, rash and wounds.  Neurological: Denies dizziness, seizures, syncope, weakness, lightheadedness, numbness and headaches.  Psychiatric/Behavioral: Denies mood changes, confusion, nervousness, sleep disturbance and agitation.   SUBJECTIVE:  Patient awake, alert, in no distress. Pain controlled currently, answering questions. Intermittently apneic.  VITAL SIGNS: BP 116/65 mmHg  Pulse 100  Temp(Src) 97.8 F (36.6 C) (Oral)  Resp 13  Ht 5\' 8"  (1.727 m)  Wt 175 lb (79.379 kg)  BMI 26.61 kg/m2   SpO2 100%  LMP  (LMP Unknown)  HEMODYNAMICS:    VENTILATOR SETTINGS:    INTAKE / OUTPUT:    PHYSICAL EXAMINATION:  General: White female, alert, cooperative, NAD. HEENT: Pupils pinpoint, EOMI. Moist mucus membranes Neck: Full range of motion without pain, supple, no lymphadenopathy or carotid bruits Lungs: Clear to ascultation bilaterally, normal work of respiration, no wheezes, rales, rhonchi Heart: RRR, no murmurs, gallops, or rubs. Chest tenderness.  Abdomen: Soft, non-tender, non-distended, BS + Extremities: No cyanosis, clubbing. Trace edema. Right hip dressing intact.  Neurologic: Alert & oriented x3, cranial nerves II-XII intact, strength grossly intact, sensation intact to light touch   LABS:  BMET No results for input(s): NA, K, CL, CO2, BUN, CREATININE, GLUCOSE in the last 168 hours.  Electrolytes No results for input(s): CALCIUM, MG, PHOS in the last 168 hours.  CBC  Recent Labs Lab 08/25/15 1514  WBC 17.4*  HGB 13.7  HCT 43.0  PLT 224    Coag's No results for input(s): APTT, INR in the last 168 hours.  Sepsis Markers No results for input(s): LATICACIDVEN, PROCALCITON, O2SATVEN in the last 168 hours.  ABG No results for input(s): PHART, PCO2ART, PO2ART in the last 168 hours.  Liver Enzymes No results for input(s): AST, ALT, ALKPHOS, BILITOT, ALBUMIN in the last 168 hours.  Cardiac Enzymes No results for input(s): TROPONINI, PROBNP in the last 168 hours.  Glucose No results for input(s): GLUCAP in the last 168 hours.  Imaging Dg Pelvis Portable  08/25/2015  CLINICAL DATA:  Broken internal right hip prosthesis. EXAM: PORTABLE PELVIS 1-2 VIEWS COMPARISON:  Pelvis MRI 07/03/2015 FINDINGS: Total right hip arthroplasty with cerclage wires and trochanteric claw. The known greater trochanteric fracture is extremely subtle. No dislocation. IMPRESSION: No acute finding after right hip arthroplasty revision. Electronically Signed   By: Monte Fantasia  M.D.   On: 08/25/2015 12:48   Dg Chest Port 1 View  08/25/2015  CLINICAL DATA:  Chest pain following CPR EXAM: PORTABLE CHEST 1 VIEW COMPARISON:  August 14, 2015 FINDINGS: There is no edema or consolidation. Heart size and pulmonary vascularity are normal. No adenopathy. The patient has a fixation rod in the upper thoracic region with a fracture of the proximal aspect on the right, a finding present on prior study. Patient is status post median sternotomy. No pneumothorax. There is upper thoracic levoscoliosis. No acute fracture. IMPRESSION: No edema or consolidation. Fractured thoracic fixation rod, a stable finding. No pneumothorax. Electronically Signed   By: Lowella Grip III M.D.   On: 08/25/2015 16:09    STUDIES:  CXR 1/9 >> No acute findings  CULTURES: None  ANTIBIOTICS: None  SIGNIFICANT EVENTS: 1/9 >> Right total hip 1/9>> Code Blue with respiratory arrest, ROSC.   LINES/TUBES: PIV  DISCUSSION: 55 y.o. female w/ PMHx of HLD, depression ,OSA not on CPAP, and OA, admitted to 5N after right total hip arthroplasty,   ASSESSMENT / PLAN:  PULMONARY A: Respiratory Depression 2/2 Narcotics Respiratory Acidosis H/o OSA not on CPAP P:   Supplemental O2 Narcan gtt; discontinue infusion if severe pain, restart at half the rate if continued respiratory depression  CARDIOVASCULAR A:  PEA arrest with ROSC Mild tachycardia Mild troponin elevation, likely 2/2 CPR P:  Continue telemetry Repeat EKG in AM  RENAL A:  Mixed Respiratory and Lactic acidosis P:   BMP, lactic acid, magnesium pending Check ABG after narcan gtt started Give 1L NS bolus, then continue maintenance fluids at 125 cc/hr Repeat BMP in AM  GASTROINTESTINAL A:   Nausea Nutrition P:   Zofran, Phenergan prn NPO for now  HEMATOLOGIC A:   Leukocytosis; likely reactive to recent hip surgery and PEA arrest P:  Repeat CBC in AM  INFECTIOUS A:   No issues P:   CBC as above  ENDOCRINE A:    No issues   P:   CBG on BMP  NEUROLOGIC A:   Acute encephalopathy 2/2 narcotics P:   Narcan gtt; stop infusion if severe right hip pain. Restart at half initial dose if continued respiratory depression. ABG as above  MUSCULOSKELETAL A: Right total hip arthroplasty P: Control pain with Toradol and Tylenol for now.  Can restart narcotic pain medications after ~4 hours and stable respiratory status Ortho aware   Natasha Bence, MD PGY-3, Internal Medicine Pager: [phone]   08/25/2015, 4:36 PM   STAFF NOTE: Linwood Dibbles, MD FACP have personally reviewed patient's available data, including medical history, events of note, physical examination and test results as part of my evaluation. I have discussed with resident/NP and other care providers such as pharmacist, RN and RRT. In addition, I personally evaluated patient and elicited key findings of: becoming more responsive, sp higher dose dilauded, narcan stat , ph noted, consider narcan drip as well until resp status / ventilation improvement then dc narcan drip, repeat abg in about 30 min , stat pcxr, ortho notified, volume upright position, use non narcs for pain control as best we can for now, ecg to assess rt heart strain, clinical scenario NOT c/w PE The patient is critically ill with multiple organ systems failure and requires high complexity decision making for assessment and support, frequent evaluation and titration of therapies, application of advanced monitoring technologies and extensive interpretation of multiple databases.   Critical Care Time devoted to patient care services described in this note is30 Minutes. This time reflects time of care of this signee: Merrie Roof, MD FACP. This critical care time does not reflect procedure time, or teaching time or supervisory time of PA/NP/Med student/Med Resident etc but could involve care discussion time. Rest per NP/medical resident whose note is outlined above and that  I agree with   Lavon Paganini. Titus Mould, MD, [place] Pgr: [facility] 08/25/2015 5:24 PM

## 2015-08-25 NOTE — Code Documentation (Signed)
CODE BLUE NOTE  Patient Name: Alicia Dunn   MRN: [id number]   Date of Birth/ Sex: 1961-01-03 , female      Admission Date: 08/25/2015  Attending Provider: Frederik Pear, MD  Primary Diagnosis: Complication of internal right hip prosthesis [facility])    Indication: Pt was in her usual state of health until this PM, when she was noted to have agonal breathing. Code blue was subsequently called. At the time of arrival on scene, ACLS protocol was underway.    Technical Description:  - CPR performance duration:  2  minutes  - Was defibrillation or cardioversion used? No   - Was external pacer placed? No  - Was patient intubated pre/post CPR? No    Medications Administered: Y = Yes; Blank = No Amiodarone    Atropine    Calcium    Epinephrine  X  Lidocaine    Magnesium    Norepinephrine    Phenylephrine    Sodium bicarbonate    Vasopressin    Narcan x2  Post CPR evaluation:  - Final Status - Was patient successfully resuscitated ? Yes - What is current rhythm? Sinus - What is current hemodynamic status? stable   Miscellaneous Information:  - Labs sent, including: i-stat K  - Primary team notified?  Yes  - Family Notified? Yes  - Additional notes/ transfer status: Transfer to ICU        Sela Hua, MD  08/25/2015, 3:37 PM

## 2015-08-25 NOTE — Progress Notes (Signed)
Patient complains of nausea and refuses to wear Bipap.  In no apparent respiratory distress at this time.  RT will continue to monitor.

## 2015-08-25 NOTE — Op Note (Signed)
Preop diagnosis: Painful right total up arthroplasty from 2010 with a modular metal-on-metal liner, MRI scan is consistent with a periprosthetic fluid collection 6 x 5 x 4 cm in diameter no obvious loosening of any implants.   Postoperative diagnosis: Adverse tissue reaction to metal-on-metal bearing system with fluid collection, periprosthetic greater trochanteric fracture found at the time of surgery not visible on preoperative x-rays taken in November.  Procedure: Revision right total hip arthroplasty with removal 52 mm modular metal liner, 32 mm S-ROM femoral head, and then open reduction internal fixation of a greater trochanter periprosthetic fracture with the medium Zimmer [employer] trochanteric plate.  Surgeon: Kathalene Frames. Mayer Camel M.D.  Assistant: Kerry Hough. Barton Dubois  (present throughout entire procedure and necessary for timely completion of the procedure)  Estimated blood loss: 500 mL  Fluid replacement: 1800 mL of crystalloid cc of crystalloid  Complications: Periprosthetic fracture identified at the time of surgery greater trochanter displaced a few millimeters with granulation tissue and metal reaction in the fracture site.  Indications: Patient with an modular metal-on-metal, S-ROM total hip that did very well until a few months ago when he had increasing groin pain. The pain wakes her t night and recently got to the point where he could no longer go walking on a regular basis. MRI scan showed fluid collection, but no bony destruction. Plain x-rays show no change in the position of the components and the stem appears to be well ingrown. Risks and benefits of revision surgery have been discussed and questions answered.  Procedure: Patient was identified by arm band receive preoperative IV antibiotics in the holding area at, and hospital. He was then taken to the operating room where the appropriate anesthetic monitors were attached and general endotracheal anesthesia induced with the patient  in the supine position. He was then rolled into the L lateral decubitus position and fixed there with a mark 2 pelvic clamp. A Foley catheter was inserted and the limb prepped and draped in usual sterile fashion from the ankle to the hemipelvis. Time out procedure performed. Skin along the lateral hip and thigh infiltrated with 10 cc of 1/2% Marcaine and epinephrine solution. We began the operation by recreating the old posterior lateral incision 15 cm in line through the skin and subcutaneous tissue down to the level of the IT band which was cut in line with the skin incision. This exposed the sac of fluid which was sent off for Gram stain and culture. It came back with no cells. The sac was excised. We immediately identified a 3 mm displaced fracture of the greater trochanter at the level of the shoulder of the S-ROM stem with adverse tissue metal assist that had eroded some of the bone. We continued to strip membrane from around the bone and using curettes and Rodgers got back to healthy bony tissue. We then remove scar tissue from around to the Pinnacle cup and femoral stem trunnion, dislocated a total hip and removed the 32 mm +0 metal head with a mallet and metal cylinder. The trunnion was then tucked anterior and superior to the acetabulum and we continued to remove scar tissue from around the acetabular component. Using a metal cylinder we tapped the Pinnacle cup and the metal liner came out without difficulty there was no obvious wear. The central occluder was removed with a hand was placed into the socket and it was not loose to manual testing. We then thoroughly irrigated out the acetabular shell, a central occluder was screwed into  place followed by a 10 +4 polyethylene liner. A trial reduction was then performed with a +3 36 mm femoral head instability was noted to 90 of flexion 70 of internal rotation and in full extension the hip could not be dislocated with external rotation. At this point a real  +3 36 mm ceramic head was hammered into place, the hip reduced and irrigated with normal saline solution. We then reduced the trochanteric fracture with a large tenaculum and applied a medium Zimmer trochanteric plate with 4 cables that cerclage around the femur with a 2 proximal cables going just below the lesser trochanter. The spikes of the plate were hammered into the trochanteric bone and the cables were then tensioned sequentially 2 with good firm fixation. We screwed the cables into their final position and cut the residual cable. The capsular flap was repaired back to the intertrochanteric crest through drill holes with #2 Ethibond suture. We then closed the vastus lateralis with 0 Vicryl, the IT band with running #1 Vicryl suture, the subcutaneous tissue with 0 and 2-0 undyed Vicryl suture, the skin was closed with running interlocking 3-0 nylon suture. A dressing of Mepilex was then applied, the patient was unclamped a rolled supine awakened extubated and taken to the recovery without difficulty. During the procedure the wound was irrigated with 2 g of topical Tranexamic acid on a sponge.

## 2015-08-25 NOTE — Progress Notes (Signed)
Called to bedside on floor for an Intubation. Pt S/P THR under general today. She received 250 micrograms of Fentanyl and 2 mg of Dilaudid intra-op. In the PACU she received 5 mg Dilaudid and 2 Oxycontin tabs as well as 500 of Robaxin IV. Pt was apparently having agonal respirations on admission to the floor without a blood pressure. Epinephrine 1mg  was given as well as 0.8mg  Narcan.   When I saw her the patient was breathing well. 100% O2 Sat and oriented to place and time and answering questions.  I elected not to intubate her and transfer her to the ICU for observation. I emphasized to the transferring nurses the possibility of her needing more Narcan due to its short half-life. Discussed with Dr Mayer Camel, who was on the way into see her.  Lillia Abed MD

## 2015-08-26 ENCOUNTER — Encounter (HOSPITAL_COMMUNITY): Payer: Self-pay | Admitting: Orthopedic Surgery

## 2015-08-26 DIAGNOSIS — R0782 Intercostal pain: Secondary | ICD-10-CM

## 2015-08-26 LAB — BASIC METABOLIC PANEL
Anion gap: 7 (ref 5–15)
BUN: 12 mg/dL (ref 6–20)
CALCIUM: 8.1 mg/dL — AB (ref 8.9–10.3)
CHLORIDE: 106 mmol/L (ref 101–111)
CO2: 25 mmol/L (ref 22–32)
CREATININE: 0.73 mg/dL (ref 0.44–1.00)
GFR calc non Af Amer: >60 mL/min (ref >60)
GLUCOSE: 114 mg/dL — AB (ref 65–99)
Potassium: 5 mmol/L (ref 3.5–5.1)
Sodium: 138 mmol/L (ref 135–145)

## 2015-08-26 LAB — POCT I-STAT 3, ART BLOOD GAS (G3+)
ACID-BASE DEFICIT: 9 mmol/L — AB (ref 0.0–2.0)
BICARBONATE: 21.5 meq/L (ref 20.0–24.0)
O2 SAT: 100 %
TCO2: 24 mmol/L (ref 0–100)
pCO2 arterial: 67.2 mmHg (ref 35.0–45.0)
pH, Arterial: 7.114 — CL (ref 7.350–7.450)
pO2, Arterial: 409 mmHg — ABNORMAL HIGH (ref 80.0–100.0)

## 2015-08-26 LAB — CBC
HCT: 39.9 % (ref 36.0–46.0)
HEMOGLOBIN: 12.8 g/dL (ref 12.0–15.0)
MCH: 30.4 pg (ref 26.0–34.0)
MCHC: 32.1 g/dL (ref 30.0–36.0)
MCV: 94.8 fL (ref 78.0–100.0)
Platelets: 215 10*3/uL (ref 150–400)
RBC: 4.21 MIL/uL (ref 3.87–5.11)
RDW: 15.5 % (ref 11.5–15.5)
WBC: 9.8 10*3/uL (ref 4.0–10.5)

## 2015-08-26 MED ORDER — PREDNISONE 20 MG PO TABS
20.0000 mg | ORAL_TABLET | Freq: Two times a day (BID) | ORAL | Status: AC
  Administered 2015-08-26 – 2015-08-27 (×3): 20 mg via ORAL
  Filled 2015-08-26 (×3): qty 1

## 2015-08-26 MED ORDER — HEPARIN SODIUM (PORCINE) 5000 UNIT/ML IJ SOLN
5000.0000 [IU] | Freq: Three times a day (TID) | INTRAMUSCULAR | Status: DC
  Administered 2015-08-26 – 2015-08-27 (×5): 5000 [IU] via SUBCUTANEOUS
  Filled 2015-08-26 (×5): qty 1

## 2015-08-26 NOTE — Progress Notes (Signed)
Referred to CSW today for ?SNF. Chart reviewed and noted PT recomendations for honme with HH.  RNCM also indicates plans for home with [facility]. CSW to sign off- please contact us if SW needs arise. Eduard Clos, MSW, [place]

## 2015-08-26 NOTE — Care Management Note (Signed)
Case Management Note  Patient Details  Name: Alicia Dunn MRN: [id number] Date of Birth: Sep 11, 1960  Subjective/Objective:          S/p revision right total hip arthroplasy          Action/Plan: Set up with [facility] for HHPT by MD office. Spoke with patent and her daughter, no change in discharge plan. Patient stated that she has a rolling walker and a 3N1 at home. Patient stated that her daughter will be able to assist her after discharge. Will continue to follow for discharge needs.   Expected Discharge Date:                  Expected Discharge Plan:  [place]  In-House Referral:  NA  Discharge planning Services  CM Consult  Post Acute Care Choice:  Home Health Choice offered to:  Patient  DME Arranged:    DME Agency:     HH Arranged:  PT Reserve:  [place]  Status of Service:  In process, will continue to follow  Medicare Important Message Given:    Date Medicare IM Given:    Medicare IM give by:    Date Additional Medicare IM Given:    Additional Medicare Important Message give by:     If discussed at [place] of Stay Meetings, dates discussed:    Additional Comments:  Nila Nephew, RN 08/26/2015, 4:03 PM

## 2015-08-26 NOTE — Evaluation (Signed)
Physical Therapy Evaluation Patient Details Name: Alicia Dunn MRN: [id number] DOB: 11/12/1960 Today's Date: 08/26/2015   History of Present Illness  [name] is a 55 y.o. female w/ PMHx of HLD, depression ,OSA not on CPAP, and OA, admitted to 5N after right total hip arthroplasty revision.  Post operative complication of PEA arrest likely due to narcotics and was transferred to ICU.  Also found to have periprosthetic fx now s/p ORIF with THA revision.  Clinical Impression  Patient presents with decreased mobility due to deficits listed in PT problem list.  She will benefit from skilled PT in the acute setting to allow return home with assist and HHPT.    Follow Up Recommendations Home health PT;Supervision/Assistance - 24 hour    Equipment Recommendations  Rolling walker with 5" wheels    Recommendations for Other Services       Precautions / Restrictions Precautions Precautions: Posterior Hip;Fall Precaution Booklet Issued: No Precaution Comments: patient able to recall 2/3 prior to any education due to previous experience after first THA Restrictions RLE Weight Bearing: Partial weight bearing RLE Partial Weight Bearing Percentage or Pounds: 50%      Mobility  Bed Mobility Overal bed mobility: Needs Assistance Bed Mobility: Supine to Sit     Supine to sit: Min assist;Mod assist     General bed mobility comments: cues for technique due to pt recalling she placed L foot under R to move it, educated how to use crutch to assist R leg off bed (but assisted her due to not having crutch here,) then assisted to lift trunk and scoot to EOB  Transfers Overall transfer level: Needs assistance Equipment used: Rolling walker (2 wheeled) Transfers: Sit to/from Stand Sit to Stand: Min assist;From elevated surface         General transfer comment: cues for technique to due PWB and hip precautions  Ambulation/Gait Ambulation/Gait assistance: Min assist Ambulation  Distance (Feet): 5 Feet Assistive device: Rolling walker (2 wheeled) Gait Pattern/deviations: Step-to pattern;Antalgic;Decreased step length - left;Decreased stance time - right     General Gait Details: short steps at bedside, limited due to chest pain; cues for PWB and able to maintain  Stairs            Wheelchair Mobility    Modified Rankin (Stroke Patients Only)       Balance Overall balance assessment: Needs assistance Sitting-balance support: Bilateral upper extremity supported Sitting balance-Leahy Scale: Poor Sitting balance - Comments: leaning back on UE's due to hip prec   Standing balance support: Bilateral upper extremity supported Standing balance-Leahy Scale: Poor Standing balance comment: UE support needed due to weight bearing restrictions                             Pertinent Vitals/Pain Pain Assessment: 0-10 Pain Score: 6  Pain Location: right quad into knee Pain Descriptors / Indicators: Aching Pain Intervention(s): Monitored during session;Premedicated before session    Home Living Family/patient expects to be discharged to:: Private residence Living Arrangements: Alone Available Help at Discharge: Family;Available 24 hours/day Type of Home: House Home Access: Stairs to enter   [employer] of Steps: 1 Home Layout: One level Home Equipment: Walker - standard;Crutches;Bedside commode;Hand held shower head;Cane - single point Additional Comments: daughter to stay 2 weeks; used crutches previously, but reports MD wants her to use walker, states can use 3:1 as shower seat    Prior Function Level of Independence: Independent with  assistive device(s)         Comments: using crutches and cane     Hand Dominance   Dominant Hand: Right    Extremity/Trunk Assessment   Upper Extremity Assessment: Defer to OT evaluation           Lower Extremity Assessment: RLE deficits/detail RLE Deficits / Details: ankle AROM  WFL, strength 2+/5 at quad with pain, hip/knee flexion in supine about 50.       Communication   Communication: No difficulties  Cognition Arousal/Alertness: Awake/alert Behavior During Therapy: WFL for tasks assessed/performed Overall Cognitive Status: Within Functional Limits for tasks assessed                      General Comments      Exercises Total Joint Exercises Ankle Circles/Pumps: AROM;Both;10 reps;Supine Quad Sets: AROM;Right;5 reps;Supine Heel Slides: AAROM;Right;5 reps;Supine      Assessment/Plan    PT Assessment Patient needs continued PT services  PT Diagnosis Difficulty walking;Acute pain   PT Problem List Decreased strength;Decreased range of motion;Decreased knowledge of use of DME;Decreased activity tolerance;Decreased balance;Decreased mobility;Pain;Decreased knowledge of precautions  PT Treatment Interventions DME instruction;Balance training;Gait training;Stair training;Functional mobility training;Patient/family education;Therapeutic activities;Therapeutic exercise   PT Goals (Current goals can be found in the Care Plan section) Acute Rehab PT Goals Patient Stated Goal: To return to independent PT Goal Formulation: With patient Time For Goal Achievement: 09/02/15 Potential to Achieve Goals: Good    Frequency 7X/week   Barriers to discharge        Co-evaluation               End of Session Equipment Utilized During Treatment: Gait belt Activity Tolerance: Patient limited by pain Patient left: in chair;with call bell/phone within reach;with family/visitor present Nurse Communication: Mobility status         Time: 1000-1045 PT Time Calculation (min) (ACUTE ONLY): 45 min   Charges:   PT Evaluation $PT Eval Moderate Complexity: 1 Procedure PT Treatments $Gait Training: 8-22 mins   PT G CodesReginia Dunn September 02, 2015, 11:06 AM  Alicia Dunn, [place] 09-02-2015

## 2015-08-26 NOTE — Progress Notes (Signed)
Attempted to call report nurse is currently unavailable and will return the call

## 2015-08-26 NOTE — Consult Note (Signed)
PULMONARY / CRITICAL CARE MEDICINE   Name: Alicia Dunn MRN: [id number] DOB: 1960/11/14    ADMISSION DATE:  08/25/2015 CONSULTATION DATE:  08/25/15  REFERRING MD:  Mayer Camel  CHIEF COMPLAINT:  Respiratory/PEA arrest from narcs  SUBJECTIVE:  Awake, off narcan infusion, normal resp pattern   VITAL SIGNS: BP 94/56 mmHg  Pulse 103  Temp(Src) 99.8 F (37.7 C) (Oral)  Resp 17  Ht 5\' 8"  (1.727 m)  Wt 81.9 kg (180 lb 8.9 oz)  BMI 27.46 kg/m2  SpO2 96%  LMP  (LMP Unknown)  HEMODYNAMICS:    VENTILATOR SETTINGS:    INTAKE / OUTPUT: I/O last 3 completed shifts: In: 4218.8 [P.O.:240; I.V.:2868.8; IV Piggyback:1110] Out: 2550 [Urine:2300; Blood:250]  PHYSICAL EXAMINATION:  General: White female, alert, cooperative no distress HEENT: Pupils pinpoint, EOMI Neck: no stridor Lungs: Clear to ascultation bilaterally Heart: RRR, no murmurs, gallops, or rubs. Chest tenderness from cpr, remains  Abdomen: Soft, non-tender, non-distended, BS + Extremities:edema none Neurologic: Alert & oriented x3, cranial nerves II-XII intact, strength grossly intact   LABS:  BMET  Recent Labs Lab 08/25/15 1514 08/26/15 0221  NA 136 138  K 4.5 5.0  CL 103 106  CO2 18* 25  BUN 17 12  CREATININE 1.07* 0.73  GLUCOSE 446* 114*    Electrolytes  Recent Labs Lab 08/25/15 1514 08/26/15 0221  CALCIUM 8.2* 8.1*  MG 2.3  --     CBC  Recent Labs Lab 08/25/15 1514 08/26/15 0221  WBC 17.4* 9.8  HGB 13.7 12.8  HCT 43.0 39.9  PLT 224 215    Coag's No results for input(s): APTT, INR in the last 168 hours.  Sepsis Markers  Recent Labs Lab 08/25/15 1514 08/25/15 2036  LATICACIDVEN 5.3* 3.2*    ABG  Recent Labs Lab 08/25/15 1749  PHART 7.244*  PCO2ART 41.6  PO2ART 107.0*    Liver Enzymes No results for input(s): AST, ALT, ALKPHOS, BILITOT, ALBUMIN in the last 168 hours.  Cardiac Enzymes  Recent Labs Lab 08/25/15 1514  TROPONINI 0.16*    Glucose No results for  input(s): GLUCAP in the last 168 hours.  Imaging Dg Pelvis Portable  08/25/2015  CLINICAL DATA:  Broken internal right hip prosthesis. EXAM: PORTABLE PELVIS 1-2 VIEWS COMPARISON:  Pelvis MRI 07/03/2015 FINDINGS: Total right hip arthroplasty with cerclage wires and trochanteric claw. The known greater trochanteric fracture is extremely subtle. No dislocation. IMPRESSION: No acute finding after right hip arthroplasty revision. Electronically Signed   By: Monte Fantasia M.D.   On: 08/25/2015 12:48   Dg Chest Port 1 View  08/25/2015  CLINICAL DATA:  Chest pain following CPR EXAM: PORTABLE CHEST 1 VIEW COMPARISON:  August 14, 2015 FINDINGS: There is no edema or consolidation. Heart size and pulmonary vascularity are normal. No adenopathy. The patient has a fixation rod in the upper thoracic region with a fracture of the proximal aspect on the right, a finding present on prior study. Patient is status post median sternotomy. No pneumothorax. There is upper thoracic levoscoliosis. No acute fracture. IMPRESSION: No edema or consolidation. Fractured thoracic fixation rod, a stable finding. No pneumothorax. Electronically Signed   By: Lowella Grip III M.D.   On: 08/25/2015 16:09    STUDIES:  CXR 1/9 >> No acute findings  CULTURES: None  ANTIBIOTICS: None  SIGNIFICANT EVENTS: 1/9 >> Right total hip 1/9>> Code Blue with respiratory arrest, ROSC. Narcan drip 1/10- no distress, fully awale  LINES/TUBES: PIV  DISCUSSION: 55 y.o. female w/ PMHx of HLD,  depression ,OSA not on CPAP, and OA, admitted to 5N after right total hip arthroplasty,   ASSESSMENT / PLAN:  PULMONARY A: Respiratory Depression 2/2 Narcotics Respiratory Acidosis H/o OSA not on CPAP P:   Supplemental O2 Narcan off IS Would limit gross pos balance, was pos over 2 liters yesterday Caution narcs dosing No repeat abg required, clinically resolved  CARDIOVASCULAR A:  PEA arrest with ROSC Mild tachycardia Mild  troponin elevation, likely 2/2 CPR Chest pain from cpr P:  consider steroids x 4 doses for cartilage pain  RENAL A:   Mixed Respiratory and Lactic acidosis resolved P:   bmet in am for k  kvo  GASTROINTESTINAL A:   Nausea resolved Nutrition P:   Zofran, Phenergan prn [facility] diet  HEMATOLOGIC A:   Leukocytosis; likely reactive to recent hip surgery and PEA arrest P:  Post op cbc in am  i see no pharmokinetic dvt prevention, will add sub q hep but would consider more aggressive prevention after d/w ortho  INFECTIOUS A:   No issues P:   CBC am for wbc  ENDOCRINE A:   No issues   P:   CBG on BMP  NEUROLOGIC A:   Acute encephalopathy 2/2 narcotics - resolved P:   Narcan gtt off Prn low dose pain relief, nonnarcs as able  MUSCULOSKELETAL A: Right total hip arthroplasty P: Control pain with Toradol and Tylenol for now.  Per ortho   To floor Will sign off, call if needed  Lavon Paganini. Titus Mould, MD, [place] Pgr: [facility]

## 2015-08-26 NOTE — Progress Notes (Signed)
Report called to Traci RN on unit 5N. Patient transferred via bed to 5N21.

## 2015-08-26 NOTE — Care Management Note (Signed)
Case Management Note  Patient Details  Name: Alicia Dunn MRN: [id number] Date of Birth: 1961-03-06  Subjective/Objective:     Adm w hip surgery               Action/Plan: lives at home, pcp dr Asa Lente   Expected Discharge Date:                  Expected Discharge Plan:     In-House Referral:     Discharge planning Services     Post Acute Care Choice:    Choice offered to:     DME Arranged:    DME Agency:     HH Arranged:    [place] Agency:     Status of Service:     Medicare Important Message Given:    Date Medicare IM Given:    Medicare IM give by:    Date Additional Medicare IM Given:    Additional Medicare Important Message give by:     If discussed at [place] of Stay Meetings, dates discussed:    Additional Comments: ur review done, pt was set up pta w [facility] homecare.  Lacretia Leigh, RN 08/26/2015, 7:47 AM

## 2015-08-26 NOTE — Progress Notes (Signed)
Alicia Dunn [id number] Admission Data: 08/26/2015 3:18 PM Attending Provider: Raylene Miyamoto, MD  [id number] Asa Lente, MD Consults/ Treatment Team: Treatment Team:  Md Pccm, MD  Alicia Dunn is a 55 y.o. female patient transferred from [facility], awake, alert  & orientated  X 4,  Full Code, VSS - Blood pressure 128/81, pulse 94, temperature 99.9 F (37.7 C), temperature source Oral, resp. rate 16, height 5\' 8"  (1.727 m), weight 81.9 kg (180 lb 8.9 oz), SpO2 99 %., O2    2 L nasal cannular, no c/o shortness of breath, no c/o chest pain, no distress noted.    IV site WDL: left wrist and left forearm with a transparent dsg that's clean dry and intact.  Allergies:   Allergies  Allergen Reactions  . Augmentin [Amoxicillin-Pot Clavulanate] Rash and Nausea Only    + abdominal cramping   . Scopolamine Other (See Comments)    Other Reaction: Migraines  . Oxycodone-Acetaminophen Other (See Comments)    Other Reaction: hallucinations Hypersensitive, /w itching   . Iohexol      Desc: PREMED WITH BENADRYL, only oral contrast /w CAT, not MRI contrast    . Morphine     hallucinations  . Tramadol Other (See Comments)    Other Reaction: hallucinations, sweating     Past Medical History  Diagnosis Date  . ARTHRITIS   . CARDIAC ARRHYTHMIA   . DEPRESSION   . HYPERLIPIDEMIA   . RHINOPLASTY, HX OF   . UTI'S, HX OF   . Cervical stenosis of spinal canal   . Family history of adverse reaction to anesthesia     N&V- Mother of pt.   . ASTHMA     has Dulera for PRN use, not used in past month   . Anxiety     + panic attacks in past   . Kidney infection     2010 , perhaps, unsure of date  . GERD (gastroesophageal reflux disease)   . MIGRAINE HEADACHE     Zomig nasal spray, but hasn't used in long time  . Arthritis     OA, hip, back- lumbar spine     Pt orientation to unit, room and routine.SR up x 2, fall risk assessment complete with Patient and family verbalizing understanding of risks  associated with falls. Pt verbalizes an understanding of how to use the call bell and to call for help before getting out of bed.  Skin showed no evidence of skin break down noted on exam, RN noted surgical dressing to right hip. Right leg is bruised. Skin assessment done with charge RN Santiago Glad.   Will cont to monitor and assist as needed.  Dorita Fray, RN 08/26/2015 1:00PM

## 2015-08-26 NOTE — Progress Notes (Signed)
Occupational Therapy Evaluation Patient Details Name: Alicia Dunn MRN: [id number] DOB: 1961/08/11 Today's Date: 08/26/2015    History of Present Illness [name] is a 55 y.o. female w/ PMHx of HLD, depression ,OSA not on CPAP, and OA, admitted to 5N after right total hip arthroplasty revision.  Post operative complication of PEA arrest likely due to narcotics and was transferred to ICU.  Also found to have periprosthetic fx now s/p ORIF with THA revision.   Clinical Impression   PTA, pt was independent with all ADLs and used RW or SPC for mobility. Pt currently presents with acute R hip and thigh pain and severe chest pain (as a result of chest compressions). Pt required min-min guard assist with all functional transfers and ADLs due to pain and to maintain partial WB on RLE. Educated and practiced with AE for LB ADLs and pt's daughter was independent with assisting pt with transfers and bed mobility. Pt will benefit from continued acute OT to increase independence and safety with ADLs and mobility to allow for safe discharge home with assistance from her daughter.    Follow Up Recommendations  No OT follow up;Supervision/Assistance - 24 hour    Equipment Recommendations  Other (comment) (RW-2 wheeled)    Recommendations for Other Services       Precautions / Restrictions Precautions Precautions: Posterior Hip;Fall Precaution Booklet Issued: No Precaution Comments: Pt able to recall 3/3 precautions at beginning of session and demonstrated good adherence to precautions during ADLs. Restrictions Weight Bearing Restrictions: Yes RLE Weight Bearing: Partial weight bearing RLE Partial Weight Bearing Percentage or Pounds: 50%      Mobility Bed Mobility Overal bed mobility: Needs Assistance Bed Mobility: Supine to Sit;Sit to Supine     Supine to sit: Mod assist Sit to supine: Min assist   General bed mobility comments: Mod assist to progress bil LE off bed and to support  trunk to come to sitting position due to increased chest pain. Min assist with daughter independent in assisting to move RLE onto bed.  Transfers Overall transfer level: Needs assistance Equipment used: Rolling walker (2 wheeled) Transfers: Sit to/from Stand Sit to Stand: Min assist         General transfer comment: Min assist for boost to stand due to severe chest pain. Daughter independent in assisting. Verbal cues for safe hand placement and proper RW use    Balance Overall balance assessment: Needs assistance Sitting-balance support: No upper extremity supported;Feet supported Sitting balance-Leahy Scale: Poor Sitting balance - Comments: leaning back on UE's due to hip prec   Standing balance support: Bilateral upper extremity supported;During functional activity Standing balance-Leahy Scale: Poor Standing balance comment: UE support needed due to weight bearing restrictions                            ADL Overall ADL's : Needs assistance/impaired     Grooming: Wash/dry hands;Min guard;Standing   Upper Body Bathing: Supervision/ safety;Sitting   Lower Body Bathing: Min guard;With adaptive equipment;Cueing for compensatory techniques;Sit to/from stand Lower Body Bathing Details (indicate cue type and reason): using long-handled sponge Upper Body Dressing : Supervision/safety;Sitting   Lower Body Dressing: Minimal assistance;Cueing for compensatory techniques;With adaptive equipment;Sit to/from stand Lower Body Dressing Details (indicate cue type and reason): Using reacher and sock aid Toilet Transfer: Minimal assistance;Ambulation;BSC;RW   Toileting- Water quality scientist and Hygiene: Min guard;Sit to/from stand       Functional mobility during ADLs: Minimal assistance;Rolling  walker General ADL Comments: Min assist for functional transfers due to severe chest pain (from CPR compressions). Educated and practiced with AE for LB ADLs and discussed step sequence  for shower transfer. Daughter assisted pt during all transfers and bed mobility as she will be the primary caregiver for the first 2 weeks.     Vision Vision Assessment?: No apparent visual deficits   Perception     Praxis      Pertinent Vitals/Pain Pain Assessment: 0-10 Pain Score: 5  Pain Location: chest and R quad Pain Descriptors / Indicators: Aching;Stabbing Pain Intervention(s): Limited activity within patient's tolerance;Monitored during session;Premedicated before session;Repositioned;Ice applied     Hand Dominance Right   Extremity/Trunk Assessment Upper Extremity Assessment Upper Extremity Assessment: Overall WFL for tasks assessed   Lower Extremity Assessment Lower Extremity Assessment: Defer to PT evaluation   Cervical / Trunk Assessment Cervical / Trunk Assessment: Normal   Communication Communication Communication: No difficulties   Cognition Arousal/Alertness: Awake/alert Behavior During Therapy: WFL for tasks assessed/performed Overall Cognitive Status: Within Functional Limits for tasks assessed                     General Comments       Exercises       Shoulder Instructions      Home Living Family/patient expects to be discharged to:: Private residence Living Arrangements: Alone Available Help at Discharge: Family;Available 24 hours/day Type of Home: House Home Access: Stairs to enter [employer] of Steps: 1   Home Layout: One level     Bathroom Shower/Tub: Walk-in shower;Door   Bathroom Toilet: Handicapped height     Home Equipment: Walker - standard;Crutches;Bedside commode;Hand held shower head;Cane - single point   Additional Comments: daughter to stay 2 weeks; used crutches previously, but reports MD wants her to use walker, states can use 3:1 as shower seat      Prior Functioning/Environment Level of Independence: Independent with assistive device(s)        Comments: using crutches and cane    OT  Diagnosis: Acute pain   OT Problem List: Decreased strength;Decreased range of motion;Decreased activity tolerance;Impaired balance (sitting and/or standing);Decreased coordination;Decreased safety awareness;Decreased knowledge of use of DME or AE;Pain   OT Treatment/Interventions: Self-care/ADL training;Therapeutic exercise;Energy conservation;DME and/or AE instruction;Therapeutic activities;Patient/family education;Balance training    OT Goals(Current goals can be found in the care plan section) Acute Rehab OT Goals Patient Stated Goal: to go home OT Goal Formulation: With patient Time For Goal Achievement: 09/09/15 Potential to Achieve Goals: Good ADL Goals Pt Will Perform Lower Body Dressing: with supervision;with caregiver independent in assisting;with adaptive equipment;sit to/from stand Pt Will Transfer to Toilet: with supervision;ambulating (comfort height toilet) Pt Will Perform Toileting - Clothing Manipulation and hygiene: with supervision;with caregiver independent in assisting;sit to/from stand Pt Will Perform Tub/Shower Transfer: Shower transfer;with supervision;ambulating;3 in 1;rolling walker  OT Frequency: Min 2X/week   Barriers to D/C:            Co-evaluation              End of Session Equipment Utilized During Treatment: Gait belt;Rolling walker Nurse Communication: Mobility status;Precautions  Activity Tolerance: Patient limited by pain Patient left: in bed;with call bell/phone within reach;with family/visitor present   Time: [id number] OT Time Calculation (min): 25 min Charges:  OT General Charges $OT Visit: 1 Procedure OT Evaluation $OT Eval Moderate Complexity: 1 Procedure OT Treatments $Self Care/Home Management : 8-22 mins G-Codes:    Redmond Baseman, OTR/L Pager:  [phone] 08/26/2015, 3:09 PM

## 2015-08-26 NOTE — Progress Notes (Signed)
Patient ID: Alicia Dunn, female   DOB: 02/06/1961, 55 y.o.   MRN: [id number] PATIENT ID: Alicia Dunn  MRN: [id number]  DOB/AGE:  Jun 11, 1961 / 55 y.o.  1 Day Post-Op Procedure(s) (LRB): RIGHT TOTAL HIP REVISION, ORIF PERI-PROSTHETIC FRACTURE (Right)    PROGRESS NOTE Subjective: Patient is alert, oriented, no Nausea, no Vomiting, yes passing gas, . Taking PO well. Denies SOB, Chest or Calf Pain. Shortly after transfer from PACU yesterday, after arrival on 5 N. the patient was found to have agonal breathing and was pulseless. She received 2 minutes of chest compressions IV epinephrine and Narcan and woke up relatively quickly. Per chart review, patient received a total of 5 mg Dilaudid, 2 OxyContin tablets, as well as 500 mg of Robaxin IV from 12:23-13:58 PM, while in the PACU. She was admitted to critical care or chest x-ray showed no evidence of aspiration, her blood work normalized and she is awake alert and talking. She does report some chest soreness from the compressions were done yesterday.   Objective: Vital signs in last 24 hours: Filed Vitals:   08/26/15 0300 08/26/15 0400 08/26/15 0500 08/26/15 0600  BP: 85/59 98/67 92/60  99/66  Pulse: 96 97 98 101  Temp:  98.7 F (37.1 C)    TempSrc:  Oral    Resp: 11 11 11 14   Height:      Weight:      SpO2: 96% 98% 96% 96%      Intake/Output from previous day: I/O last 3 completed shifts: In: 4218.8 [P.O.:240; I.V.:2868.8; IV Piggyback:1110] Out: 2550 [Urine:2300; Blood:250]   Intake/Output this shift:     LABORATORY DATA:  Recent Labs  08/25/15 1514 08/26/15 0221  WBC 17.4* 9.8  HGB 13.7 12.8  HCT 43.0 39.9  PLT 224 215  NA 136 138  K 4.5 5.0  CL 103 106  CO2 18* 25  BUN 17 12  CREATININE 1.07* 0.73  GLUCOSE 446* 114*  CALCIUM 8.2* 8.1*    Examination: Neurologically intact ABD soft Neurovascular intact Sensation intact distally Intact pulses distally Dorsiflexion/Plantar flexion intact Incision: dressing  C/D/I No cellulitis present Compartment soft} XR AP&Lat of hip shows well placed\fixed THA  Assessment:   1 Day Post-Op Procedure(s) (LRB): RIGHT TOTAL HIP REVISION, ORIF PERI-PROSTHETIC FRACTURE (Right) ADDITIONAL DIAGNOSIS:  Expected Acute Blood Loss Anemia, cardiopulmonary arrest probably secondary to narcotic.  Plan: Patient may be transferred to the orthopedic floor 5 N. once cleared by critical care this morning. We discussed the probable cause of her cardiopulmonary arrest yesterday, the fact that she is doing well now, she should tolerate 50% weightbearing once we get her to the orthopedic floor. We will be cautious about the use of narcotics going forward.  PT/OT WBAT, THA  posterior precautions  DVT Prophylaxis: SCDx72 hrs, ASA 325 mg BID x 2 weeks  DISCHARGE PLAN: Home  DISCHARGE NEEDS: HHPT, Walker and 3-in-1 comode seat

## 2015-08-26 NOTE — Progress Notes (Signed)
Physical Therapy Treatment Patient Details Name: Alicia Dunn MRN: [id number] DOB: [birth year] Today's Date: 08/26/2015    History of Present Illness Alicia Dunn is a 55 y.o. female w/ PMHx of HLD, depression ,OSA not on CPAP, and OA, admitted to 5N after right total hip arthroplasty revision.  Post operative complication of PEA arrest likely due to narcotics and was transferred to ICU.  Also found to have periprosthetic fx now s/p ORIF with THA revision.    PT Comments    Patient limited with ambulation tolerance due to stating she let pain get ahead of her and should have asked for medication earlier.  Did make it to and from bathroom and daughter able to practice assisting pt with cues for technique and maintaining hip precautions.  Main limiting factor is chest soreness with having to use walker with PWB status.  Will continue skilled acute PT and progress as tolerated.  Follow Up Recommendations  Home health PT;Supervision/Assistance - 24 hour     Equipment Recommendations  Rolling walker with 5" wheels    Recommendations for Other Services       Precautions / Restrictions Precautions Precautions: Posterior Hip;Fall Precaution Booklet Issued: No Precaution Comments: Pt able to recall 3/3 precautions at beginning of session and demonstrated good adherence to precautions during ADLs. Restrictions Weight Bearing Restrictions: Yes RLE Weight Bearing: Partial weight bearing RLE Partial Weight Bearing Percentage or Pounds: 50%    Mobility  Bed Mobility Overal bed mobility: Needs Assistance Bed Mobility: Supine to Sit;Sit to Supine     Supine to sit: Min assist Sit to supine: Min assist   General bed mobility comments: observed and educated daughter on how to assist with R LE for supine <> sit  Transfers Overall transfer level: Needs assistance Equipment used: Rolling walker (2 wheeled) Transfers: Sit to/from Stand Sit to Stand: Min guard         General transfer  comment: supervised daughter assisting from bed to stand, assisted for safety with supervision sit<>stand from 3:1 over toilet  Ambulation/Gait Ambulation/Gait assistance: Min guard;Supervision Ambulation Distance (Feet): 12 Feet (x 2) Assistive device: Rolling walker (2 wheeled) Gait Pattern/deviations: Step-to pattern;Decreased stride length;Antalgic     General Gait Details: to/from bathroom with RW and assist for safety   Stairs            Wheelchair Mobility    Modified Rankin (Stroke Patients Only)       Balance Overall balance assessment: Needs assistance Sitting-balance support: No upper extremity supported;Feet supported Sitting balance-Leahy Scale: Poor Sitting balance - Comments: leaning back on UE's due to hip prec   Standing balance support: During functional activity;No upper extremity supported Standing balance-Leahy Scale: Fair Standing balance comment: standing at sink to wash hands after toileting with weight shifted L and supervision                    Cognition Arousal/Alertness: Awake/alert Behavior During Therapy: WFL for tasks assessed/performed Overall Cognitive Status: Within Functional Limits for tasks assessed                      Exercises      General Comments General comments (skin integrity, edema, etc.): Daughter present for session      Pertinent Vitals/Pain Pain Assessment: 0-10 Pain Score: 7  Pain Location: chest and R quad Pain Descriptors / Indicators: Aching;Stabbing Pain Intervention(s): Limited activity within patient's tolerance;Monitored during session;Ice applied;Repositioned    Home Living Family/patient expects to be  discharged to:: Private residence Living Arrangements: Alone Available Help at Discharge: Family;Available 24 hours/day Type of Home: House Home Access: Stairs to enter   Home Layout: One level Home Equipment: Walker - standard;Crutches;Bedside commode;Hand held shower head;Cane -  single point Additional Comments: daughter to stay 2 weeks; used crutches previously, but reports MD wants her to use walker, states can use 3:1 as shower seat    Prior Function Level of Independence: Independent with assistive device(s)      Comments: using crutches and cane   PT Goals (current goals can now be found in the care plan section) Acute Rehab PT Goals Patient Stated Goal: to go home Progress towards PT goals: Progressing toward goals    Frequency  7X/week    PT Plan Current plan remains appropriate    Co-evaluation             End of Session Equipment Utilized During Treatment: Gait belt Activity Tolerance: Patient limited by pain Patient left: in bed;with call bell/phone within reach     Time: 1600-1615 PT Time Calculation (min) (ACUTE ONLY): 15 min  Charges:  $Gait Training: 8-22 mins                    G Codes:      Reginia Naas 08-30-2015, 4:54 PM  Magda Kiel, [place] 08/30/2015

## 2015-08-27 LAB — CBC
HCT: 37 % (ref 36.0–46.0)
HEMOGLOBIN: 11.8 g/dL — AB (ref 12.0–15.0)
MCH: 30.1 pg (ref 26.0–34.0)
MCHC: 31.9 g/dL (ref 30.0–36.0)
MCV: 94.4 fL (ref 78.0–100.0)
PLATELETS: 190 10*3/uL (ref 150–400)
RBC: 3.92 MIL/uL (ref 3.87–5.11)
RDW: 16 % — ABNORMAL HIGH (ref 11.5–15.5)
WBC: 10.7 10*3/uL — ABNORMAL HIGH (ref 4.0–10.5)

## 2015-08-27 LAB — BASIC METABOLIC PANEL
Anion gap: 7 (ref 5–15)
BUN: 10 mg/dL (ref 6–20)
CHLORIDE: 107 mmol/L (ref 101–111)
CO2: 29 mmol/L (ref 22–32)
CREATININE: 0.64 mg/dL (ref 0.44–1.00)
Calcium: 8.5 mg/dL — ABNORMAL LOW (ref 8.9–10.3)
Glucose, Bld: 101 mg/dL — ABNORMAL HIGH (ref 65–99)
POTASSIUM: 4.5 mmol/L (ref 3.5–5.1)
SODIUM: 143 mmol/L (ref 135–145)

## 2015-08-27 MED ORDER — GUAIFENESIN 200 MG PO TABS
200.0000 mg | ORAL_TABLET | ORAL | Status: DC | PRN
  Administered 2015-08-27: 200 mg via ORAL
  Filled 2015-08-27 (×2): qty 1

## 2015-08-27 NOTE — Progress Notes (Signed)
OT Cancellation Note  Patient Details Name: Alicia Dunn MRN: [id number] DOB: [birth year]   Cancelled Treatment:    Reason Eval/Treat Not Completed: Patient declined to participate in therapy this afternoon. Pt/daughter state that they feel confident to complete all transfers and ADLs with daughter assisting as needed.   Redmond Baseman, OTR/L Pager: [phone] 08/27/2015, 3:02 PM

## 2015-08-27 NOTE — Discharge Summary (Signed)
Patient ID: Alicia Dunn MRN: [id number] DOB/AGE: 54-02-1961 54 y.o.  Admit date: 08/25/2015 Discharge date: 08/27/2015  Admission Diagnoses:  Principal Problem:   Complication of internal right hip prosthesis ([place]) Active Problems:   Primary osteoarthritis of right hip   Chest pain   H/O total hip arthroplasty   Discharge Diagnoses:  Same  Past Medical History  Diagnosis Date  . ARTHRITIS   . CARDIAC ARRHYTHMIA   . DEPRESSION   . HYPERLIPIDEMIA   . RHINOPLASTY, HX OF   . UTI'S, HX OF   . Cervical stenosis of spinal canal   . Family history of adverse reaction to anesthesia     N&V- Mother of pt.   . ASTHMA     has Dulera for PRN use, not used in past month   . Anxiety     + panic attacks in past   . Kidney infection     2010 , perhaps, unsure of date  . GERD (gastroesophageal reflux disease)   . MIGRAINE HEADACHE     Zomig nasal spray, but hasn't used in long time  . Arthritis     OA, hip, back- lumbar spine     Surgeries: Procedure(s): RIGHT TOTAL HIP REVISION, ORIF PERI-PROSTHETIC FRACTURE on 08/25/2015   Consultants: Treatment Team:  Raylene Miyamoto, MD  Discharged Condition: Improved  Hospital Course: Alicia Dunn is an 55 y.o. female who was admitted 08/25/2015 for operative treatment ofComplication of internal right hip prosthesis ([place]). Patient has severe unremitting pain that affects sleep, daily activities, and work/hobbies. After pre-op clearance the patient was taken to the operating room on 08/25/2015 and underwent  Procedure(s): RIGHT TOTAL HIP REVISION, ORIF PERI-PROSTHETIC FRACTURE.    Patient was given perioperative antibiotics: Anti-infectives    Start     Dose/Rate Route Frequency Ordered Stop   08/25/15 0800  vancomycin (VANCOCIN) IVPB 1000 mg/200 mL premix     1,000 mg 200 mL/hr over 60 Minutes Intravenous To [facility] 08/24/15 1045 08/25/15 0836       Patient was given sequential compression devices, early ambulation, and  chemoprophylaxis to prevent DVT.  Patient benefited maximally from hospital stay and there were no complications.    Recent vital signs: Patient Vitals for the past 24 hrs:  BP Temp Temp src Pulse Resp SpO2  08/27/15 1300 129/71 mmHg 99.2 F (37.3 C) - 98 18 98 %  08/27/15 0539 120/69 mmHg 98.8 F (37.1 C) Oral 100 16 98 %  08/26/15 2045 (!) 109/59 mmHg 98.1 F (36.7 C) Oral (!) 104 16 99 %     Recent laboratory studies:  Recent Labs  08/26/15 0221 08/27/15 0720  WBC 9.8 10.7*  HGB 12.8 11.8*  HCT 39.9 37.0  PLT 215 190  NA 138 143  K 5.0 4.5  CL 106 107  CO2 25 29  BUN 12 10  CREATININE 0.73 0.64  GLUCOSE 114* 101*  CALCIUM 8.1* 8.5*     Discharge Medications:     Medication List    STOP taking these medications        HYDROcodone-acetaminophen 5-325 MG tablet  Commonly known as:  NORCO/VICODIN  Replaced by:  HYDROcodone-acetaminophen 10-325 MG tablet      TAKE these medications        aspirin EC 325 MG tablet  Take 1 tablet (325 mg total) by mouth 2 (two) times daily.     carisoprodol 350 MG tablet  Commonly known as:  SOMA  Take 1 tablet (350 mg total)  by mouth at bedtime.     cetirizine 10 MG tablet  Commonly known as:  ZYRTEC  Take 10 mg by mouth daily.     Fish Oil 1000 MG Caps  Take by mouth daily.     HYDROcodone-acetaminophen 10-325 MG tablet  Commonly known as:  NORCO  Take 1 tablet by mouth every 6 (six) hours as needed.     meloxicam 15 MG tablet  Commonly known as:  MOBIC  Take 1 tablet (15 mg total) by mouth daily.     pantoprazole 40 MG tablet  Commonly known as:  PROTONIX  Take 1 tablet (40 mg total) by mouth daily.     SEROQUEL XR 150 MG 24 hr tablet  Generic drug:  QUEtiapine Fumarate  Take 1 tablet (150 mg total) by mouth at bedtime. Must establish with NEW PCP for additional refills.        Diagnostic Studies: Dg Chest 2 View  08/14/2015  CLINICAL DATA:  Patient with history of arrhythmia and asthma. EXAM: CHEST  2  VIEW COMPARISON:  Chest radiograph 02/28/2012 FINDINGS: Stable cardiac and mediastinal contours. No consolidative pulmonary opacities. No pleural effusion or pneumothorax. Tortuosity of the thoracic aorta. Scoliotic curvature of the thoracolumbar spine. Re- demonstrated fracture through the proximal aspect of the Harrington rod. IMPRESSION: No acute cardiopulmonary process. Re- demonstrated fracture through the proximal aspect of the Harrington rod. Electronically Signed   By: Lovey Newcomer M.D.   On: 08/14/2015 12:38   Dg Pelvis Portable  08/25/2015  CLINICAL DATA:  Broken internal right hip prosthesis. EXAM: PORTABLE PELVIS 1-2 VIEWS COMPARISON:  Pelvis MRI 07/03/2015 FINDINGS: Total right hip arthroplasty with cerclage wires and trochanteric claw. The known greater trochanteric fracture is extremely subtle. No dislocation. IMPRESSION: No acute finding after right hip arthroplasty revision. Electronically Signed   By: Monte Fantasia M.D.   On: 08/25/2015 12:48   Dg Chest Port 1 View  08/25/2015  CLINICAL DATA:  Chest pain following CPR EXAM: PORTABLE CHEST 1 VIEW COMPARISON:  August 14, 2015 FINDINGS: There is no edema or consolidation. Heart size and pulmonary vascularity are normal. No adenopathy. The patient has a fixation rod in the upper thoracic region with a fracture of the proximal aspect on the right, a finding present on prior study. Patient is status post median sternotomy. No pneumothorax. There is upper thoracic levoscoliosis. No acute fracture. IMPRESSION: No edema or consolidation. Fractured thoracic fixation rod, a stable finding. No pneumothorax. Electronically Signed   By: Lowella Grip III M.D.   On: 08/25/2015 16:09    Disposition:       Discharge Instructions    Call MD / Call 911    Complete by:  As directed   If you experience chest pain or shortness of breath, CALL 911 and be transported to the hospital emergency room.  If you develope a fever above 101 F, pus (white  drainage) or increased drainage or redness at the wound, or calf pain, call your surgeon's office.     Change dressing    Complete by:  As directed   You may change your dressing on day 5, then change the dressing daily with sterile 4 x 4 inch gauze dressing and paper tape.  You may clean the incision with alcohol prior to redressing     Constipation Prevention    Complete by:  As directed   Drink plenty of fluids.  Prune juice may be helpful.  You may use a stool softener, such  as Colace (over the counter) 100 mg twice a day.  Use MiraLax (over the counter) for constipation as needed.     Diet - low sodium heart healthy    Complete by:  As directed      Driving restrictions    Complete by:  As directed   No driving for 2 weeks     Follow the hip precautions as taught in Physical Therapy    Complete by:  As directed      Increase activity slowly as tolerated    Complete by:  As directed      Patient may shower    Complete by:  As directed   You may shower without a dressing once there is no drainage.  Do not wash over the wound.  If drainage remains, cover wound with plastic wrap and then shower.           Follow-up Information    Follow up with Kerin Salen, MD In 2 weeks.   Specialty:  Orthopedic Surgery   Contact information:   [place] [address] [phone]       Follow up with [facility].   Why:  They will contact you schedule home therapy visits.   Contact information:   [place] [address] [phone]        Signed: Theodosia Quay 08/27/2015, 3:46 PM

## 2015-08-27 NOTE — Progress Notes (Signed)
Physical Therapy Treatment Patient Details Name: Alicia Dunn MRN: [id number] DOB: 13-Jun-1961 Today's Date: 08/27/2015    History of Present Illness Alicia Dunn is a 55 y.o. female w/ PMHx of HLD, depression ,OSA not on CPAP, and OA, admitted to [facility] after right total hip arthroplasty revision.  Post operative complication of PEA arrest likely due to narcotics and was transferred to ICU.  Also found to have periprosthetic fx now s/p ORIF with THA revision.    PT Comments    Patient seen for PT session with a focus on mobility in anticipation of D/C to home. Patient able to ambulate 60 feet with rw and up/down 1 step. Additionally the patient recalling 3/3 precautions and consistent with 50%WB. Reviewed HEP and provided to patient. Follow session patient states that she feels confident with D/C to home and denies any questions or concerns.   Follow Up Recommendations  Home health PT;Supervision for mobility/OOB     Equipment Recommendations  Rolling walker with 5" wheels    Recommendations for Other Services       Precautions / Restrictions Precautions Precautions: Posterior Hip;Fall Precaution Booklet Issued: Yes (comment) Precaution Comments: reviewed precautions, patient 3/3 with recall, HEP provided and reviewed Restrictions Weight Bearing Restrictions: Yes RLE Weight Bearing: Partial weight bearing RLE Partial Weight Bearing Percentage or Pounds: 50%    Mobility  Bed Mobility Overal bed mobility: Needs Assistance Bed Mobility: Sit to Supine       Sit to supine: Supervision (using hook technique)   General bed mobility comments: bed flat, no rails  Transfers Overall transfer level: Needs assistance Equipment used: Rolling walker (2 wheeled) Transfers: Sit to/from Stand Sit to Stand: Min guard;Supervision         General transfer comment: consistent with precautions  Ambulation/Gait Ambulation/Gait assistance: Supervision Ambulation Distance (Feet): 60  Feet Assistive device: Rolling walker (2 wheeled) Gait Pattern/deviations: Step-to pattern Gait velocity: decreased   General Gait Details: verbal reminder for 50% WB prior to ambulation   Stairs Stairs: Yes Stairs assistance: Min guard Stair Management: No rails;Backwards;With walker Number of Stairs: 1 General stair comments: patient states she feels confident with getting into her home  Wheelchair Mobility    Modified Rankin (Stroke Patients Only)       Balance Overall balance assessment: Needs assistance Sitting-balance support: No upper extremity supported Sitting balance-Leahy Scale: Normal     Standing balance support: No upper extremity supported Standing balance-Leahy Scale: Fair Standing balance comment: static standing                    Cognition Arousal/Alertness: Awake/alert Behavior During Therapy: WFL for tasks assessed/performed Overall Cognitive Status: Within Functional Limits for tasks assessed                      Exercises Total Joint Exercises Ankle Circles/Pumps: AROM;Both;10 reps;Supine Quad Sets: Strengthening;Right;10 reps;Supine Short Arc Quad: Strengthening;Right;10 reps;Supine Heel Slides: AAROM;Right;10 reps;Supine Hip ABduction/ADduction: Strengthening;Right;10 reps;Other (comment) (mod assist)    General Comments        Pertinent Vitals/Pain Pain Assessment: 0-10 Pain Score: 6  Pain Location: chest and thigh Pain Intervention(s): Limited activity within patient's tolerance;Monitored during session    Home Living                      Prior Function            PT Goals (current goals can now be found in the care plan section) Acute  Rehab PT Goals Patient Stated Goal: to go home PT Goal Formulation: With patient Time For Goal Achievement: 09/02/15 Potential to Achieve Goals: Good Progress towards PT goals: Progressing toward goals    Frequency  7X/week    PT Plan Current plan remains  appropriate    Co-evaluation             End of Session Equipment Utilized During Treatment: Gait belt Activity Tolerance: Patient tolerated treatment well Patient left: in bed;with call bell/phone within reach     Time: 1138-1220 PT Time Calculation (min) (ACUTE ONLY): 42 min  Charges:  $Gait Training: 23-37 mins $Therapeutic Exercise: 8-22 mins                    G Codes:      Cassell Clement, PT, CSCS Pager [phone] Office [phone]  08/27/2015, 12:47 PM

## 2015-08-27 NOTE — Progress Notes (Signed)
Pt ready for discharge. IVs removed and pt belongings gathered. Education/instructions reviewed with pt and all questions/concerns addressed. Pt will be transported out via wheelchair to daughter's car. Will continue to monitor.

## 2015-08-27 NOTE — Progress Notes (Signed)
PATIENT ID: Alicia Dunn  MRN: [id number]  DOB/AGE:  Feb 19, 1961 / 55 y.o.  2 Days Post-Op Procedure(s) (LRB): RIGHT TOTAL HIP REVISION, ORIF PERI-PROSTHETIC FRACTURE (Right)    PROGRESS NOTE Subjective: Patient is alert, oriented, no Nausea, no Vomiting, yes passing gas, . Taking PO well. Denies SOB, Chest or Calf Pain. Using Incentive Spirometer, PAS in place. Ambulate WBAT with pt walking to bathroom several times and up with therapy x 2.  Patient reports pain as  Mild to moderate in leg.  Worse pain in her chest from CPR. .    Objective: Vital signs in last 24 hours: Filed Vitals:   08/26/15 1148 08/26/15 1255 08/26/15 2045 08/27/15 0539  BP:  128/81 109/59 120/69  Pulse:  94 104 100  Temp: 98.9 F (37.2 C) 99.9 F (37.7 C) 98.1 F (36.7 C) 98.8 F (37.1 C)  TempSrc: Oral Oral Oral Oral  Resp:  16 16 16   Height:      Weight:      SpO2:  99% 99% 98%      Intake/Output from previous day: I/O last 3 completed shifts: In: 2335 [P.O.:960; I.V.:1375] Out: 1800 [Urine:1800]   Intake/Output this shift:     LABORATORY DATA:  Recent Labs  08/25/15 1514 08/26/15 0221 08/27/15 0720  WBC 17.4* 9.8 10.7*  HGB 13.7 12.8 11.8*  HCT 43.0 39.9 37.0  PLT 224 215 190  NA 136 138  --   K 4.5 5.0  --   CL 103 106  --   CO2 18* 25  --   BUN 17 12  --   CREATININE 1.07* 0.73  --   GLUCOSE 446* 114*  --   CALCIUM 8.2* 8.1*  --     Examination: Neurologically intact Neurovascular intact Sensation intact distally Intact pulses distally Dorsiflexion/Plantar flexion intact Incision: dressing C/D/I No cellulitis present Compartment soft} XR AP&Lat of hip shows well placed\fixed THA  Assessment:   2 Days Post-Op Procedure(s) (LRB): RIGHT TOTAL HIP REVISION, ORIF PERI-PROSTHETIC FRACTURE (Right) ADDITIONAL DIAGNOSIS:  Expected Acute Blood Loss Anemia, cardiopulmonary arrest probably secondary to narcotic  Plan: PT/OT WBAT, THA  posterior precautions  DVT Prophylaxis:  SCDx72 hrs, ASA 325 mg BID x 2 weeks  DISCHARGE PLAN: Home, once pt meets therapy goals.  DISCHARGE NEEDS: HHPT, HHRN, Walker and 3-in-1 comode seat

## 2015-08-28 LAB — BODY FLUID CULTURE: CULTURE: NO GROWTH

## 2015-08-30 LAB — ANAEROBIC CULTURE

## 2015-09-03 MED FILL — Medication: Qty: 1 | Status: AC

## 2015-12-17 ENCOUNTER — Other Ambulatory Visit: Payer: Self-pay | Admitting: Internal Medicine

## 2016-01-29 ENCOUNTER — Other Ambulatory Visit: Payer: Self-pay | Admitting: Internal Medicine

## 2016-01-30 ENCOUNTER — Telehealth: Payer: Self-pay | Admitting: Family

## 2016-01-30 MED ORDER — SEROQUEL XR 150 MG PO TB24: 150.0000 mg | ORAL_TABLET | Freq: Every day | ORAL | Status: DC

## 2016-01-30 NOTE — Telephone Encounter (Signed)
Please advise 

## 2016-01-30 NOTE — Telephone Encounter (Signed)
Pt request refill for SEROQUEL XR 150 MG 24 hr tablet to be send to [facility] for 90 days supply. Pt has an appt 03/03/16.

## 2016-01-30 NOTE — Telephone Encounter (Signed)
A prescription for 1 month will be sent until seen in the office.

## 2016-02-29 ENCOUNTER — Other Ambulatory Visit: Payer: Self-pay | Admitting: Family

## 2016-03-02 ENCOUNTER — Encounter: Payer: Self-pay | Admitting: Family

## 2016-03-02 ENCOUNTER — Ambulatory Visit (INDEPENDENT_AMBULATORY_CARE_PROVIDER_SITE_OTHER): Payer: 59 | Admitting: Family

## 2016-03-02 VITALS — BP 128/84 | HR 90 | Temp 98.1°F | Ht 68.0 in | Wt 179.5 lb

## 2016-03-02 DIAGNOSIS — T84010A Broken internal right hip prosthesis, initial encounter: Secondary | ICD-10-CM

## 2016-03-02 DIAGNOSIS — Z0000 Encounter for general adult medical examination without abnormal findings: Secondary | ICD-10-CM | POA: Diagnosis not present

## 2016-03-02 DIAGNOSIS — J4520 Mild intermittent asthma, uncomplicated: Secondary | ICD-10-CM

## 2016-03-02 DIAGNOSIS — E785 Hyperlipidemia, unspecified: Secondary | ICD-10-CM

## 2016-03-02 MED ORDER — SEROQUEL XR 150 MG PO TB24: 150.0000 mg | ORAL_TABLET | Freq: Every day | ORAL | Status: DC

## 2016-03-02 MED ORDER — TRIAMCINOLONE ACETONIDE 0.1 % EX CREA: 1.0000 "application " | TOPICAL_CREAM | Freq: Two times a day (BID) | CUTANEOUS | Status: DC

## 2016-03-02 NOTE — Assessment & Plan Note (Signed)
Asthma is stable with no current exacerbations and no medication needed at this time. Classified as intermittent with no treatment necessary at this time. Continue to monitor.

## 2016-03-02 NOTE — Progress Notes (Signed)
Pre visit review using our clinic review tool, if applicable. No additional management support is needed unless otherwise documented below in the visit note. 

## 2016-03-02 NOTE — Assessment & Plan Note (Signed)
1) Anticipatory Guidance: Discussed importance of wearing a seatbelt while driving and not texting while driving; changing batteries in smoke detector at least once annually; wearing suntan lotion when outside; eating a balanced and moderate diet; getting physical activity at least 30 minutes per day.  2) Immunizations / Screenings / Labs:  All immunizations are up-to-date per recommendations. Declines hepatitis C screening today. All other screenings are up-to-date per recommendations. Blood work performed at outside facility with results provided. Significant findings include elevated total cholesterol at 297, triglycerides at 168, and LDL 186. Otherwise lab work within normal limits.  Overall well exam with risk factors for cardiovascular disease including hyperlipidemia and overweight. Patient is below threshold of LDL greater than 190 and her calculated coronary artery disease risk score is 6% indicating no medication is required at this particular time. Recommend nutritional intake modification to lower LDL and triglycerides. She'll be working with outside facility for hormone replacement and weight loss. Continue other healthy lifestyle behaviors and choices. Follow-up prevention exam in 1 year. Follow-up office visit in 6 months.  Obtain CBC, CMET, Lipid profile and TSH.

## 2016-03-02 NOTE — Assessment & Plan Note (Signed)
Blood work presented by patient consistent with hyperlipidemia although below threshold for cholesterol medication as her LDL is below 190 and her coronary artery disease risk is 6%.. Recommend lifestyle management through nutrition and physical activity. Continue to monitor.

## 2016-03-02 NOTE — Assessment & Plan Note (Signed)
Stable and progressing well with no evidence of infection or complication. Continue to increase activity as tolerated. Continue to monitor.

## 2016-03-02 NOTE — Progress Notes (Signed)
Subjective:    Patient ID: Alicia Dunn, female    DOB: 1960/11/10, 55 y.o.   MRN: [id number]  Chief Complaint  Patient presents with  . Annual Exam    HPI:  Alicia Dunn is a 55 y.o. female who presents today for an annual wellness visit.   1) Health Maintenance -   Diet - Averages about 3 meals per day consisting of fruits, vegetables, chicken, beef and pork.   Exercise - Walks her dog 2 x per day and swims a couple of times per week   2) Preventative Exams / Immunizations:  Dental -- Up to date  Vision -- Up to date   Health Maintenance  Topic Date Due  . Hepatitis C Screening  Nov 04, 1960  . HIV Screening  02/02/1977  . PAP SMEAR  01/15/2016  . INFLUENZA VACCINE  03/16/2016  . MAMMOGRAM  08/18/2017  . TETANUS/TDAP  08/20/2024  . COLONOSCOPY  10/15/2024    Immunization History  Administered Date(s) Administered  . Influenza-Unspecified 05/16/2013  . Pneumococcal Polysaccharide-23 08/20/2014  . Tdap 08/20/2014    Allergies  Allergen Reactions  . Augmentin [Amoxicillin-Pot Clavulanate] Rash and Nausea Only    + abdominal cramping   . Scopolamine Other (See Comments)    Other Reaction: Migraines  . Oxycodone-Acetaminophen Other (See Comments)    Other Reaction: hallucinations Hypersensitive, /w itching   . Iohexol      Desc: PREMED WITH BENADRYL, only oral contrast /w CAT, not MRI contrast    . Morphine     hallucinations  . Tramadol Other (See Comments)    Other Reaction: hallucinations, sweating     Outpatient Prescriptions Prior to Visit  Medication Sig Dispense Refill  . cetirizine (ZYRTEC) 10 MG tablet Take 10 mg by mouth daily.    . meloxicam (MOBIC) 15 MG tablet Take 1 tablet (15 mg total) by mouth daily. 30 tablet 0  . Omega-3 Fatty Acids (FISH OIL) 1000 MG CAPS Take by mouth daily.      . pantoprazole (PROTONIX) 40 MG tablet Take 1 tablet (40 mg total) by mouth daily. (Patient taking differently: Take 40 mg by mouth daily before  breakfast. ) 90 tablet 3  . SEROQUEL XR 150 MG 24 hr tablet Take 1 tablet (150 mg total) by mouth at bedtime. Must establish with NEW PCP for additional refills. 33 tablet 0  . aspirin EC 325 MG tablet Take 1 tablet (325 mg total) by mouth 2 (two) times daily. 30 tablet 0  . carisoprodol (SOMA) 350 MG tablet Take 1 tablet (350 mg total) by mouth at bedtime. 90 tablet 1  . HYDROcodone-acetaminophen (NORCO) 10-325 MG tablet Take 1 tablet by mouth every 6 (six) hours as needed. 60 tablet 0   No facility-administered medications prior to visit.     Past Medical History  Diagnosis Date  . ARTHRITIS   . CARDIAC ARRHYTHMIA   . DEPRESSION   . HYPERLIPIDEMIA   . RHINOPLASTY, HX OF   . UTI'S, HX OF   . Cervical stenosis of spinal canal   . Family history of adverse reaction to anesthesia     N&V- Mother of pt.   . ASTHMA     has Dulera for PRN use, not used in past month   . Anxiety     + panic attacks in past   . Kidney infection     2010 , perhaps, unsure of date  . GERD (gastroesophageal reflux disease)   . MIGRAINE HEADACHE  Zomig nasal spray, but hasn't used in long time  . Arthritis     OA, hip, back- lumbar spine      Past Surgical History  Procedure Laterality Date  . Spinal fusion  1986    Harrington rod T5-L3  . Right arthroscopic labral repair  07/2008  . Arthroscopic labral repair Left 11/2007  . Open repair spontaneous dislocation hip  03/2008    @ [facility]  . Joint replacement Right 2011    hip  . Rhinoplasty  1981  . Total hip revision Right 08/25/2015    Procedure: RIGHT TOTAL HIP REVISION, ORIF PERI-PROSTHETIC FRACTURE;  Surgeon: Frederik Pear, MD;  Location: [facility];  Service: Orthopedics;  Laterality: Right;     Family History  Problem Relation Age of Onset  . Arthritis Mother   . Hyperlipidemia Mother   . Arthritis Other   . Colon cancer Other   . Diabetes Other     grandparent  . Lung cancer Other      Social History   Social History  . Marital  Status: Single    Spouse Name: N/A  . Number of Children: 1  . Years of Education: 16   Occupational History  . Manager    Social History Main Topics  . Smoking status: Never Smoker   . Smokeless tobacco: Never Used     Comment: separated 01/2013. 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 Alcohol Use: 0.6 - 1.2 oz/week    1-2 Glasses of wine per week  . Drug Use: No  . Sexual Activity: Not on file   Other Topics Concern  . Not on file   Social History Narrative   Fun: Travel, eat, cook, read, binge watching on [employer].    Review of Systems  Constitutional: Denies fever, chills, fatigue, or significant weight gain/loss. HENT: Head: Denies headache or neck pain Ears: Denies changes in hearing, ringing in ears, earache, drainage Nose: Denies discharge, stuffiness, itching, nosebleed, sinus pain Throat: Denies sore throat, hoarseness, dry mouth, sores, thrush Eyes: Denies loss/changes in vision, pain, redness, blurry/double vision, flashing lights Cardiovascular: Denies chest pain/discomfort, tightness, palpitations, shortness of breath with activity, difficulty lying down, swelling, sudden awakening with shortness of breath Respiratory: Denies shortness of breath, cough, sputum production, wheezing Gastrointestinal: Denies dysphasia, heartburn, change in appetite, nausea, change in bowel habits, rectal bleeding, constipation, diarrhea, yellow skin or eyes Genitourinary: Denies frequency, urgency, burning/pain, blood in urine, incontinence, change in urinary strength. Musculoskeletal: Denies muscle/joint pain, stiffness, back pain, redness or swelling of joints, trauma Skin: Denies rashes, lumps, itching, dryness, color changes, or hair/nail changes Neurological: Denies dizziness, fainting, seizures, weakness, numbness, tingling, tremor Psychiatric - Denies nervousness, stress, depression or memory loss Endocrine: Denies heat or cold intolerance,  sweating, frequent urination, excessive thirst, changes in appetite Hematologic: Denies ease of bruising or bleeding     Objective:     BP 128/84 mmHg  Pulse 90  Temp(Src) 98.1 F (36.7 C) (Oral)  Ht 5\' 8"  (1.727 m)  Wt 179 lb 8 oz (81.421 kg)  BMI 27.30 kg/m2  SpO2 98%  LMP  (LMP Unknown) Nursing note and vital signs reviewed.  Physical Exam  Constitutional: She is oriented to person, place, and time. She appears well-developed and well-nourished.  HENT:  Head: Normocephalic.  Right Ear: Hearing, tympanic membrane, external ear and ear canal normal.  Left Ear: Hearing, tympanic membrane, external ear and ear canal normal.  Nose: Nose normal.  Mouth/Throat:  Uvula is midline, oropharynx is clear and moist and mucous membranes are normal.  Eyes: Conjunctivae and EOM are normal. Pupils are equal, round, and reactive to light.  Neck: Neck supple. No JVD present. No tracheal deviation present. No thyromegaly present.  Cardiovascular: Normal rate, regular rhythm, normal heart sounds and intact distal pulses.   Pulmonary/Chest: Effort normal and breath sounds normal.  Abdominal: Soft. Bowel sounds are normal. She exhibits no distension and no mass. There is no tenderness. There is no rebound and no guarding.  Musculoskeletal: Normal range of motion. She exhibits no edema or tenderness.  Lymphadenopathy:    She has no cervical adenopathy.  Neurological: She is alert and oriented to person, place, and time. She has normal reflexes. No cranial nerve deficit. She exhibits normal muscle tone. Coordination normal.  Skin: Skin is warm and dry.  Psychiatric: She has a normal mood and affect. Her behavior is normal. Judgment and thought content normal.       Assessment & Plan:   Problem List Items Addressed This Visit      Respiratory   Asthma    Asthma is stable with no current exacerbations and no medication needed at this time. Classified as intermittent with no treatment necessary  at this time. Continue to monitor.        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 Continue to increase activity as tolerated. Continue to monitor.        Other   Hyperlipidemia    Blood work presented by patient consistent with hyperlipidemia although below threshold for cholesterol medication as her LDL is below 190 and her coronary artery disease risk is 6%.. Recommend lifestyle management through nutrition and physical activity. Continue to monitor.      Routine general medical examination at a health care facility - Primary    1) Anticipatory Guidance: Discussed importance of wearing a seatbelt while driving and not texting while driving; changing batteries in smoke detector at least once annually; wearing suntan lotion when outside; eating a balanced and moderate diet; getting physical activity at least 30 minutes per day.  2) Immunizations / Screenings / Labs:  All immunizations are up-to-date per recommendations. Declines hepatitis C screening today. All other screenings are up-to-date per recommendations. Blood work performed at outside facility with results provided. Significant findings include elevated total cholesterol at 297, triglycerides at 168, and LDL 186. Otherwise lab work within normal limits.  Overall well exam with risk factors for cardiovascular disease including hyperlipidemia and overweight. Patient is below threshold of LDL greater than 190 and her calculated coronary artery disease risk score is 6% indicating no medication is required at this particular time. Recommend nutritional intake modification to lower LDL and triglycerides. She'll be working with outside facility for hormone replacement and weight loss. Continue other healthy lifestyle behaviors and choices. Follow-up prevention exam in 1 year. Follow-up office visit in 6  months.  Obtain CBC, CMET, Lipid profile and TSH.            I have discontinued Ms. Shrider's carisoprodol, aspirin EC, and HYDROcodone-acetaminophen. I have also changed her SEROQUEL XR. Additionally, I am having her start on triamcinolone cream. Lastly, I am having her maintain her Fish Oil, pantoprazole, meloxicam, cetirizine, and vitamin C.   Meds ordered this encounter  Medications  . SEROQUEL XR 150 MG 24 hr tablet    Sig: Take  1 tablet (150 mg total) by mouth at bedtime.    Dispense:  90 tablet    Refill:  1  . Ascorbic Acid (VITAMIN C) 1000 MG tablet    Sig: Take 1,000 mg by mouth daily.  Marland Kitchen triamcinolone cream (KENALOG) 0.1 %    Sig: Apply 1 application topically 2 (two) times daily.    Dispense:  30 g    Refill:  0    Order Specific Question:  Supervising Provider    Answer:  Pricilla Holm A [id number]     Follow-up: Return if symptoms worsen or fail to improve.   Mauricio Po, FNP

## 2016-03-02 NOTE — Patient Instructions (Addendum)
Thank you for choosing [facility].  Summary/Instructions:  Your prescription(s) have been submitted to your pharmacy or been printed and provided for you. Please take as directed and contact our office if you believe you are having problem(s) with the medication(s) or have any questions.  Health Maintenance, Female Adopting a healthy lifestyle and getting preventive care can go a long way to promote health and wellness. Talk with your health care provider about what schedule of regular examinations is right for you. This is a good chance for you to check in with your provider about disease prevention and staying healthy. In between checkups, there are plenty of things you can do on your own. Experts have done a lot of research about which lifestyle changes and preventive measures are most likely to keep you healthy. Ask your health care provider for more information. WEIGHT AND DIET  Eat a healthy diet  Be sure to include plenty of vegetables, fruits, low-fat dairy products, and lean protein.  Do not eat a lot of foods high in solid fats, added sugars, or salt.  Get regular exercise. This is one of the most important things you can do for your health.  Most adults should exercise for at least 150 minutes each week. The exercise should increase your heart rate and make you sweat (moderate-intensity exercise).  Most adults should also do strengthening exercises at least twice a week. This is in addition to the moderate-intensity exercise.  Maintain a healthy weight  Body mass index (BMI) is a measurement that can be used to identify possible weight problems. It estimates body fat based on height and weight. Your health care provider can help determine your BMI and help you achieve or maintain a healthy weight.  For females 20 years of age and older:   A BMI below 18.5 is considered underweight.  A BMI of 18.5 to 24.9 is normal.  A BMI of 25 to 29.9 is considered  overweight.  A BMI of 30 and above is considered obese.  Watch levels of cholesterol and blood lipids  You should start having your blood tested for lipids and cholesterol at 54 years of age, then have this test every 5 years.  You may need to have your cholesterol levels checked more often if:  Your lipid or cholesterol levels are high.  You are older than 54 years of age.  You are at high risk for heart disease.  CANCER SCREENING   Lung Cancer  Lung cancer screening is recommended for adults 55-80 years old who are at high risk for lung cancer because of a history of smoking.  A yearly low-dose CT scan of the lungs is recommended for people who:  Currently smoke.  Have quit within the past 15 years.  Have at least a 30-pack-year history of smoking. A pack year is smoking an average of one pack of cigarettes a day for 1 year.  Yearly screening should continue until it has been 15 years since you quit.  Yearly screening should stop if you develop a health problem that would prevent you from having lung cancer treatment.  Breast Cancer  Practice breast self-awareness. This means understanding how your breasts normally appear and feel.  It also means doing regular breast self-exams. Let your health care provider know about any changes, no matter how small.  If you are in your 20s or 30s, you should have a clinical breast exam (CBE) by a health care provider every 1-3 years as part of   a regular health exam.  If you are 63 or older, have a CBE every year. Also consider having a breast X-ray (mammogram) every year.  If you have a family history of breast cancer, talk to your health care provider about genetic screening.  If you are at high risk for breast cancer, talk to your health care provider about having an MRI and a mammogram every year.  Breast cancer gene (BRCA) assessment is recommended for women who have family members with BRCA-related cancers. BRCA-related  cancers include:  Breast.  Ovarian.  Tubal.  Peritoneal cancers.  Results of the assessment will determine the need for genetic counseling and BRCA1 and BRCA2 testing. Cervical Cancer Your health care provider may recommend that you be screened regularly for cancer of the pelvic organs (ovaries, uterus, and vagina). This screening involves a pelvic examination, including checking for microscopic changes to the surface of your cervix (Pap test). You may be encouraged to have this screening done every 3 years, beginning at age 56.  For women ages 45-65, health care providers may recommend pelvic exams and Pap testing every 3 years, or they may recommend the Pap and pelvic exam, combined with testing for human papilloma virus (HPV), every 5 years. Some types of HPV increase your risk of cervical cancer. Testing for HPV may also be done on women of any age with unclear Pap test results.  Other health care providers may not recommend any screening for nonpregnant women who are considered low risk for pelvic cancer and who do not have symptoms. Ask your health care provider if a screening pelvic exam is right for you.  If you have had past treatment for cervical cancer or a condition that could lead to cancer, you need Pap tests and screening for cancer for at least 20 years after your treatment. If Pap tests have been discontinued, your risk factors (such as having a new sexual partner) need to be reassessed to determine if screening should resume. Some women have medical problems that increase the chance of getting cervical cancer. In these cases, your health care provider may recommend more frequent screening and Pap tests. Colorectal Cancer  This type of cancer can be detected and often prevented.  Routine colorectal cancer screening usually begins at 54 years of age and continues through 54 years of age.  Your health care provider may recommend screening at an earlier age if you have risk  factors for colon cancer.  Your health care provider may also recommend using home test kits to check for hidden blood in the stool.  A small camera at the end of a tube can be used to examine your colon directly (sigmoidoscopy or colonoscopy). This is done to check for the earliest forms of colorectal cancer.  Routine screening usually begins at age 6.  Direct examination of the colon should be repeated every 5-10 years through 54 years of age. However, you may need to be screened more often if early forms of precancerous polyps or small growths are found. Skin Cancer  Check your skin from head to toe regularly.  Tell your health care provider about any new moles or changes in moles, especially if there is a change in a mole's shape or color.  Also tell your health care provider if you have a mole that is larger than the size of a pencil eraser.  Always use sunscreen. Apply sunscreen liberally and repeatedly throughout the day.  Protect yourself by wearing long sleeves, pants, a wide-brimmed  hat, and sunglasses whenever you are outside. HEART DISEASE, DIABETES, AND HIGH BLOOD PRESSURE   High blood pressure causes heart disease and increases the risk of stroke. High blood pressure is more likely to develop in:  People who have blood pressure in the high end of the normal range (130-139/85-89 mm Hg).  People who are overweight or obese.  People who are African American.  If you are 78-23 years of age, have your blood pressure checked every 3-5 years. If you are 76 years of age or older, have your blood pressure checked every year. You should have your blood pressure measured twice--once when you are at a hospital or clinic, and once when you are not at a hospital or clinic. Record the average of the two measurements. To check your blood pressure when you are not at a hospital or clinic, you can use:  An automated blood pressure machine at a pharmacy.  A home blood pressure  monitor.  If you are between 68 years and 85 years old, ask your health care provider if you should take aspirin to prevent strokes.  Have regular diabetes screenings. This involves taking a blood sample to check your fasting blood sugar level.  If you are at a normal weight and have a low risk for diabetes, have this test once every three years after 54 years of age.  If you are overweight and have a high risk for diabetes, consider being tested at a younger age or more often. PREVENTING INFECTION  Hepatitis B  If you have a higher risk for hepatitis B, you should be screened for this virus. You are considered at high risk for hepatitis B if:  You were born in a country where hepatitis B is common. Ask your health care provider which countries are considered high risk.  Your parents were born in a high-risk country, and you have not been immunized against hepatitis B (hepatitis B vaccine).  You have HIV or AIDS.  You use needles to inject street drugs.  You live with someone who has hepatitis B.  You have had sex with someone who has hepatitis B.  You get hemodialysis treatment.  You take certain medicines for conditions, including cancer, organ transplantation, and autoimmune conditions. Hepatitis C  Blood testing is recommended for:  Everyone born from [birth year] through 1964.  Anyone with known risk factors for hepatitis C. Sexually transmitted infections (STIs)  You should be screened for sexually transmitted infections (STIs) including gonorrhea and chlamydia if:  You are sexually active and are younger than 54 years of age.  You are older than 54 years of age and your health care provider tells you that you are at risk for this type of infection.  Your sexual activity has changed since you were last screened and you are at an increased risk for chlamydia or gonorrhea. Ask your health care provider if you are at risk.  If you do not have HIV, but are at risk, it may be  recommended that you take a prescription medicine daily to prevent HIV infection. This is called pre-exposure prophylaxis (PrEP). You are considered at risk if:  You are sexually active and do not regularly use condoms or know the HIV status of your partner(s).  You take drugs by injection.  You are sexually active with a partner who has HIV. Talk with your health care provider about whether you are at high risk of being infected with HIV. If you choose to begin PrEP, you  should first be tested for HIV. You should then be tested every 3 months for as long as you are taking PrEP.  PREGNANCY   If you are premenopausal and you may become pregnant, ask your health care provider about preconception counseling.  If you may become pregnant, take 400 to 800 micrograms (mcg) of folic acid every day.  If you want to prevent pregnancy, talk to your health care provider about birth control (contraception). OSTEOPOROSIS AND MENOPAUSE   Osteoporosis is a disease in which the bones lose minerals and strength with aging. This can result in serious bone fractures. Your risk for osteoporosis can be identified using a bone density scan.  If you are 65 years of age or older, or if you are at risk for osteoporosis and fractures, ask your health care provider if you should be screened.  Ask your health care provider whether you should take a calcium or vitamin D supplement to lower your risk for osteoporosis.  Menopause may have certain physical symptoms and risks.  Hormone replacement therapy may reduce some of these symptoms and risks. Talk to your health care provider about whether hormone replacement therapy is right for you.  HOME CARE INSTRUCTIONS   Schedule regular health, dental, and eye exams.  Stay current with your immunizations.   Do not use any tobacco products including cigarettes, chewing tobacco, or electronic cigarettes.  If you are pregnant, do not drink alcohol.  If you are  breastfeeding, limit how much and how often you drink alcohol.  Limit alcohol intake to no more than 1 drink per day for nonpregnant women. One drink equals 12 ounces of beer, 5 ounces of wine, or 1 ounces of hard liquor.  Do not use street drugs.  Do not share needles.  Ask your health care provider for help if you need support or information about quitting drugs.  Tell your health care provider if you often feel depressed.  Tell your health care provider if you have ever been abused or do not feel safe at home.   This information is not intended to replace advice given to you by your health care provider. Make sure you discuss any questions you have with your health care provider.   Document Released: 02/15/2011 Document Revised: 08/23/2014 Document Reviewed: 07/04/2013 Elsevier Interactive Patient Education 2016 [employer]  

## 2016-03-18 ENCOUNTER — Telehealth: Payer: Self-pay

## 2016-03-18 MED ORDER — QUETIAPINE FUMARATE ER 150 MG PO TB24: 150.0000 mg | ORAL_TABLET | Freq: Every day | ORAL | 1 refills | Status: DC

## 2016-03-18 NOTE — Telephone Encounter (Signed)
[facility] is requesting seroquel xr 150 mg tablets to be changed to a generic alternative.   Please advise.

## 2016-03-18 NOTE — Telephone Encounter (Signed)
Medication sent.

## 2016-03-18 NOTE — Telephone Encounter (Signed)
[facility] sent a fax request for Seroquel XR as well.

## 2016-03-19 MED ORDER — QUETIAPINE FUMARATE ER 150 MG PO TB24: 150.0000 mg | ORAL_TABLET | Freq: Every day | ORAL | 1 refills | Status: DC

## 2016-03-19 NOTE — Telephone Encounter (Signed)
Contacted pt - patient would like for the Seroquel XR to be sent mail order. RX sent as requested.

## 2016-10-09 ENCOUNTER — Ambulatory Visit: Payer: 59 | Admitting: Family Medicine

## 2016-11-20 LAB — HM MAMMOGRAPHY

## 2016-11-27 IMAGING — CR DG CHEST 2V
3 series · 3 of 3 positions shown · non-contrast
Comparison: Chest radiograph 02/28/2012

CLINICAL DATA: Patient with history of arrhythmia and asthma.

EXAM:
CHEST  2 VIEW

[w chest pa (1 of 2)]
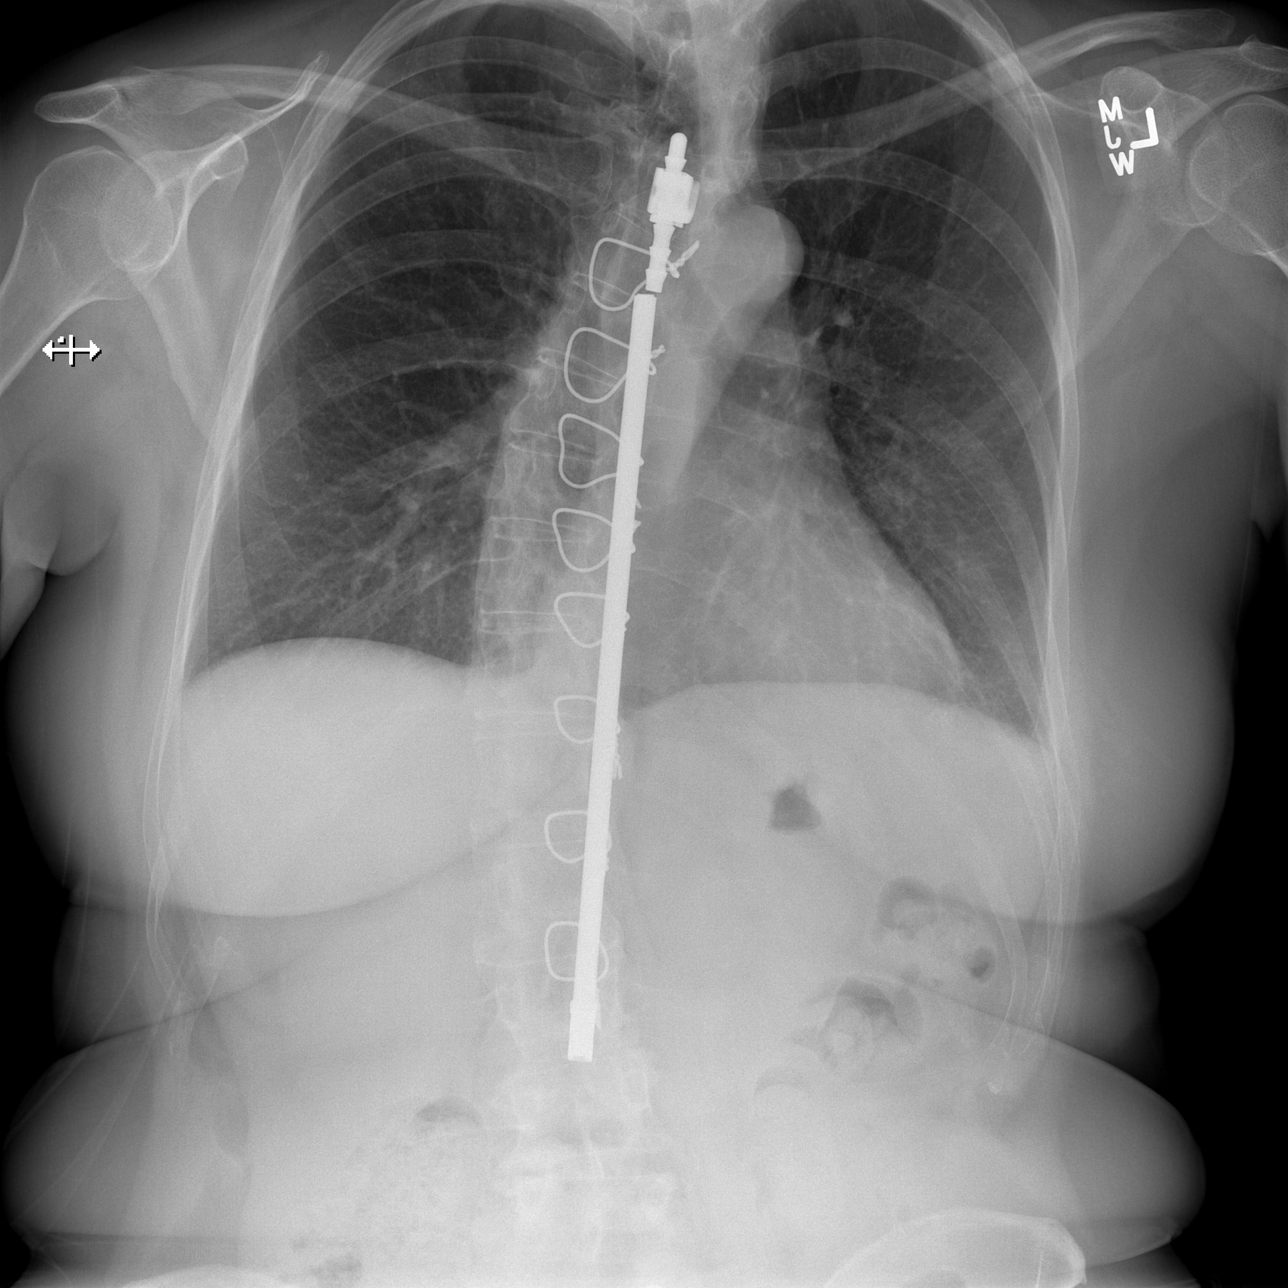

[w chest lat]
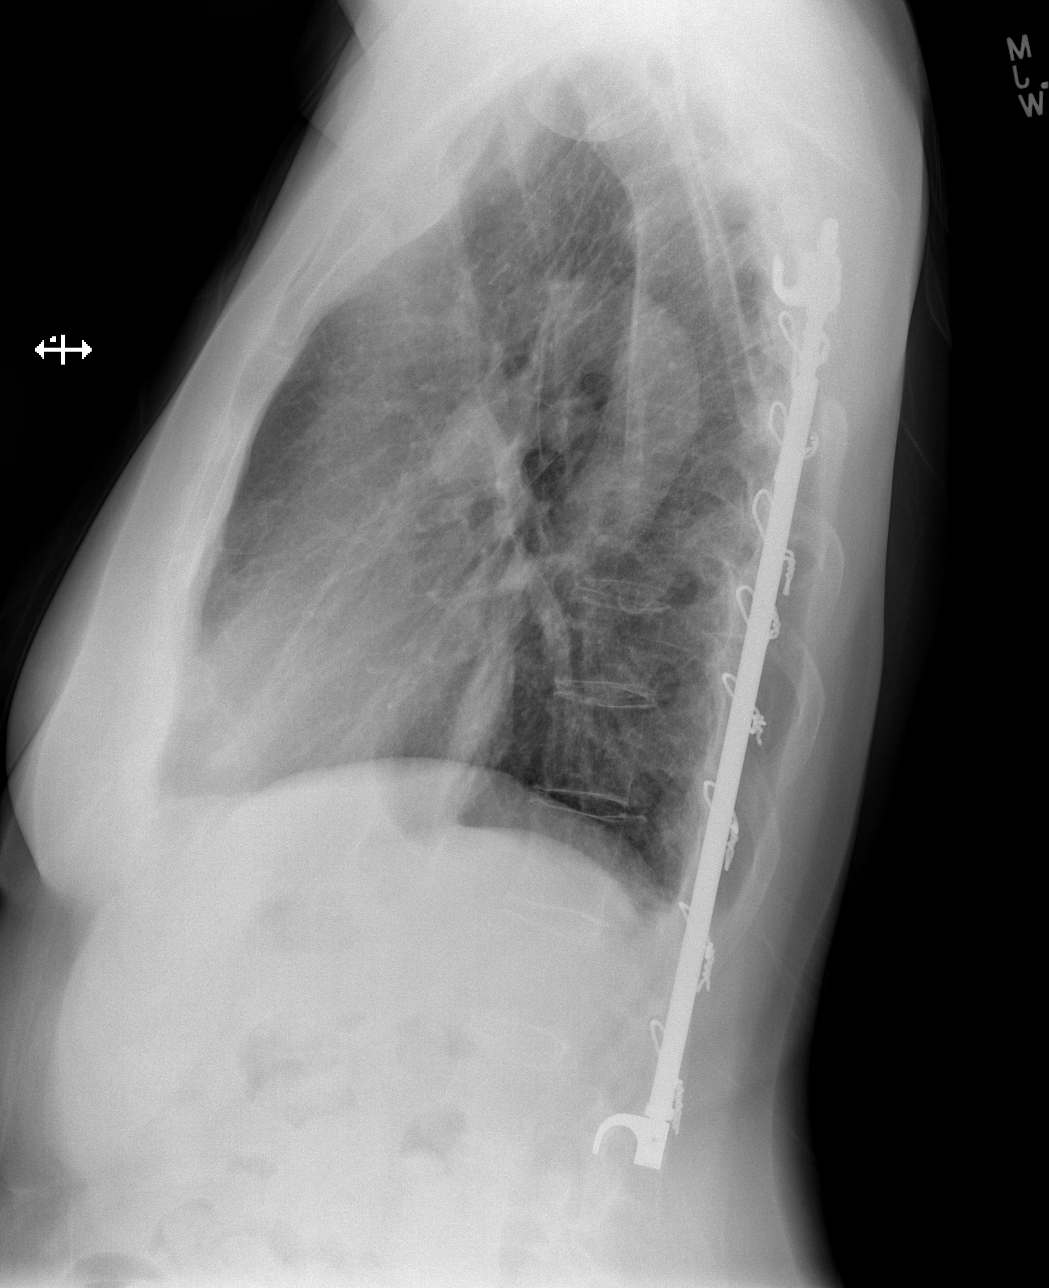

[w chest pa (2 of 2)]
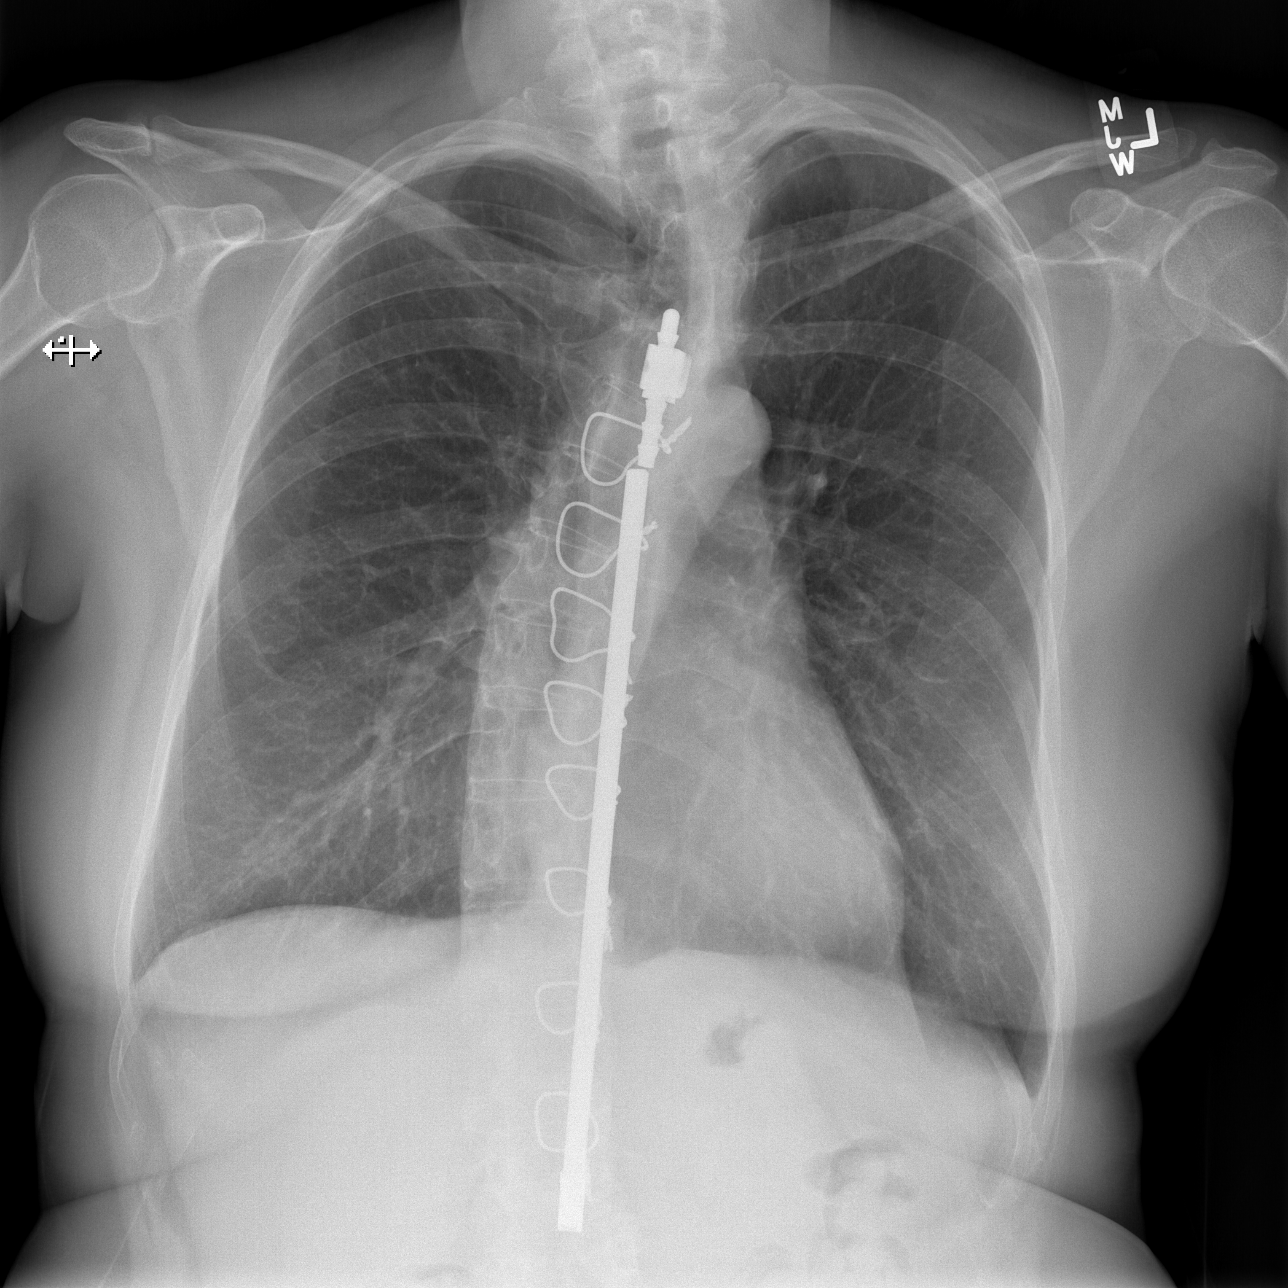

[3 of 3 positions shown; findings below may reference images not displayed]

FINDINGS: Stable cardiac and mediastinal contours. No consolidative pulmonary
opacities. No pleural effusion or pneumothorax. Tortuosity of the
thoracic aorta. Scoliotic curvature of the thoracolumbar spine. Re-
demonstrated fracture through the proximal aspect of the Harrington
rod.
IMPRESSION: No acute cardiopulmonary process.

Re- demonstrated fracture through the proximal aspect of the
Harrington rod.

## 2016-12-01 ENCOUNTER — Encounter: Payer: Self-pay | Admitting: Family

## 2017-03-03 ENCOUNTER — Ambulatory Visit (INDEPENDENT_AMBULATORY_CARE_PROVIDER_SITE_OTHER): Payer: 59 | Admitting: Family

## 2017-03-03 ENCOUNTER — Other Ambulatory Visit (INDEPENDENT_AMBULATORY_CARE_PROVIDER_SITE_OTHER): Payer: 59

## 2017-03-03 ENCOUNTER — Encounter: Payer: Self-pay | Admitting: Family

## 2017-03-03 VITALS — BP 120/72 | HR 72 | Temp 98.6°F | Resp 16 | Ht 68.0 in | Wt 167.0 lb

## 2017-03-03 DIAGNOSIS — Z0000 Encounter for general adult medical examination without abnormal findings: Secondary | ICD-10-CM

## 2017-03-03 LAB — CBC
HEMATOCRIT: 49.2 % — AB (ref 36.0–46.0)
HEMOGLOBIN: 16.6 g/dL — AB (ref 12.0–15.0)
MCHC: 33.8 g/dL (ref 30.0–36.0)
MCV: 97.3 fl (ref 78.0–100.0)
PLATELETS: 242 10*3/uL (ref 150.0–400.0)
RBC: 5.05 Mil/uL (ref 3.87–5.11)
RDW: 14.4 % (ref 11.5–15.5)
WBC: 7 10*3/uL (ref 4.0–10.5)

## 2017-03-03 LAB — COMPREHENSIVE METABOLIC PANEL
ALBUMIN: 4.6 g/dL (ref 3.5–5.2)
ALK PHOS: 85 U/L (ref 39–117)
ALT: 24 U/L (ref 0–35)
AST: 24 U/L (ref 0–37)
BUN: 16 mg/dL (ref 6–23)
CALCIUM: 10.3 mg/dL (ref 8.4–10.5)
CHLORIDE: 105 meq/L (ref 96–112)
CO2: 28 mEq/L (ref 19–32)
Creatinine, Ser: 1.02 mg/dL (ref 0.40–1.20)
GFR: 59.78 mL/min — AB (ref >60.00)
Glucose, Bld: 102 mg/dL — ABNORMAL HIGH (ref 70–99)
POTASSIUM: 5.5 meq/L — AB (ref 3.5–5.1)
Sodium: 142 mEq/L (ref 135–145)
TOTAL PROTEIN: 7.1 g/dL (ref 6.0–8.3)
Total Bilirubin: 0.6 mg/dL (ref 0.2–1.2)

## 2017-03-03 LAB — B12 AND FOLATE PANEL
Folate: >24 ng/mL (ref >5.9)
Vitamin B-12: >1500 pg/mL — ABNORMAL HIGH (ref 211–911)

## 2017-03-03 LAB — T3, FREE: T3, Free: 2.9 pg/mL (ref 2.3–4.2)

## 2017-03-03 LAB — VITAMIN D 25 HYDROXY (VIT D DEFICIENCY, FRACTURES): VITD: 47.77 ng/mL (ref 30.00–100.00)

## 2017-03-03 LAB — TSH: TSH: 3.1 u[IU]/mL (ref 0.35–4.50)

## 2017-03-03 LAB — T4, FREE: FREE T4: 0.72 ng/dL (ref 0.60–1.60)

## 2017-03-03 NOTE — Assessment & Plan Note (Signed)
1) Anticipatory Guidance: Discussed importance of wearing a seatbelt while driving and not texting while driving; changing batteries in smoke detector at least once annually; wearing suntan lotion when outside; eating a balanced and moderate diet; getting physical activity at least 30 minutes per day.  2) Immunizations / Screenings / Labs:  All immunizations are up-to-date per recommendations. Due for a vision screen encouraged to be completed independently. Declines hepatitis C screening. Due for cervical cancer screening and will follow-up with gynecology. Breast cancer and: cancer screening are up-to-date. All other screenings are up-to-date per recommendations. Obtain CBC, CMET. We will also obtain zinc, vitamin D, TSH, T4 free, T3 free, homocystine, C-reactive protein, and B12/folate for chronic inflammation.   Overall well exam with minimal risk factors for cardiovascular disease and exploring other systemic inflammation with nutrition. She is of good weight and following a Paleo style diet. Exercises regularly.  Continue healthy lifestyle behaviors and choices. Follow up prevention exam in 1 year. Follow up office visit pending blood work and for chronic conditions as needed.

## 2017-03-03 NOTE — Progress Notes (Signed)
Subjective:    Patient ID: Alicia Dunn, female    DOB: 08-03-1961, 56 y.o.   MRN: [id number]  Chief Complaint  Patient presents with  . CPE    fasting    HPI:  Alicia Dunn is a 56 y.o. female who presents today for an annual wellness visit.   1) Health Maintenance -   Diet - Averages about 3 meals per day consisting of a Paleo diet; Caffeine intake of 1-2 cups daily  Exercise - 2-3x per week; Some cardio and resistance .    2) Preventative Exams / Immunizations:  Dental -- Up to date  Vision -- Due for exam   Health Maintenance  Topic Date Due  . Hepatitis C Screening  04-Nov-1960  . HIV Screening  02/02/1977  . PAP SMEAR  01/15/2016  . INFLUENZA VACCINE  03/16/2017  . MAMMOGRAM  11/21/2018  . TETANUS/TDAP  08/20/2024  . COLONOSCOPY  10/15/2024    Immunization History  Administered Date(s) Administered  . Influenza-Unspecified 05/16/2013  . Pneumococcal Polysaccharide-23 08/20/2014  . Tdap 08/20/2014     Allergies  Allergen Reactions  . Augmentin [Amoxicillin-Pot Clavulanate] Rash and Nausea Only    + abdominal cramping   . Scopolamine Other (See Comments)    Other Reaction: Migraines  . Oxycodone-Acetaminophen Other (See Comments)    Hypersensitive, /w itching   . Iohexol      Desc: PREMED WITH BENADRYL, only oral contrast /w CAT, not MRI contrast    . Morphine     hallucinations  . Tramadol Other (See Comments)    Other Reaction: hallucinations, sweating     Outpatient Medications Prior to Visit  Medication Sig Dispense Refill  . Ascorbic Acid (VITAMIN C) 1000 MG tablet Take 1,000 mg by mouth daily.    . cetirizine (ZYRTEC) 10 MG tablet Take 10 mg by mouth daily.    . meloxicam (MOBIC) 15 MG tablet Take 1 tablet (15 mg total) by mouth daily. 30 tablet 0  . Omega-3 Fatty Acids (FISH OIL) 1000 MG CAPS Take by mouth daily.      . pantoprazole (PROTONIX) 40 MG tablet Take 1 tablet (40 mg total) by mouth daily. (Patient taking differently: Take  40 mg by mouth daily before breakfast. ) 90 tablet 3  . QUEtiapine Fumarate (SEROQUEL XR) 150 MG 24 hr tablet Take 1 tablet (150 mg total) by mouth at bedtime. 90 tablet 1  . triamcinolone cream (KENALOG) 0.1 % Apply 1 application topically 2 (two) times daily. 30 g 0   No facility-administered medications prior to visit.      Past Medical History:  Diagnosis Date  . Anxiety    + panic attacks in past   . ARTHRITIS   . Arthritis    OA, hip, back- lumbar spine   . ASTHMA    has Dulera for PRN use, not used in past month   . CARDIAC ARRHYTHMIA   . Cervical stenosis of spinal canal   . DEPRESSION   . Family history of adverse reaction to anesthesia    N&V- Mother of pt.   Marland Kitchen GERD (gastroesophageal reflux disease)   . HYPERLIPIDEMIA   . Kidney infection    2010 , perhaps, unsure of date  . MIGRAINE HEADACHE    Zomig nasal spray, but hasn't used in long time  . RHINOPLASTY, HX OF   . UTI'S, HX OF      Past Surgical History:  Procedure Laterality Date  . arthroscopic labral repair Left 11/2007  .  JOINT REPLACEMENT Right 2011   hip  . OPEN REPAIR SPONTANEOUS DISLOCATION HIP  03/2008   @ [facility]  . RHINOPLASTY  1981  . Right Arthroscopic labral repair  07/2008  . SPINAL FUSION  1986   Harrington rod T5-L3  . TOTAL HIP REVISION Right 08/25/2015   Procedure: RIGHT TOTAL HIP REVISION, ORIF PERI-PROSTHETIC FRACTURE;  Surgeon: Frederik Pear, MD;  Location: [facility];  Service: Orthopedics;  Laterality: Right;     Family History  Problem Relation Age of Onset  . Arthritis Mother   . Hyperlipidemia Mother   . Arthritis Other   . Colon cancer Other   . Diabetes Other        grandparent  . Lung cancer Other      Social History   Social History  . Marital status: Single    Spouse name: N/A  . Number of children: 1  . Years of education: 47   Occupational History  . Manager    Social History Main Topics  . Smoking status: Never Smoker  . Smokeless tobacco: Never Used      Comment: separated 01/2013. 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 Alcohol use 0.6 - 1.2 oz/week    1 - 2 Glasses of wine per week  . Drug use: No  . Sexual activity: Not on file   Other Topics Concern  . Not on file   Social History Narrative   Fun: Travel, eat, cook, read      Review of Systems  Constitutional: Denies fever, chills, fatigue, or significant weight gain/loss. HENT: Head: Denies headache or neck pain Ears: Denies changes in hearing, ringing in ears, earache, drainage Nose: Denies discharge, stuffiness, itching, nosebleed, sinus pain Throat: Denies sore throat, hoarseness, dry mouth, sores, thrush Eyes: Denies loss/changes in vision, pain, redness, blurry/double vision, flashing lights Cardiovascular: Denies chest pain/discomfort, tightness, palpitations, shortness of breath with activity, difficulty lying down, swelling, sudden awakening with shortness of breath Respiratory: Denies shortness of breath, cough, sputum production, wheezing Gastrointestinal: Denies dysphasia, heartburn, change in appetite, nausea, change in bowel habits, rectal bleeding, constipation, diarrhea, yellow skin or eyes Genitourinary: Denies frequency, urgency, burning/pain, blood in urine, incontinence, change in urinary strength. Musculoskeletal: Denies muscle/joint pain, stiffness, back pain, redness or swelling of joints, trauma Skin: Denies rashes, lumps, itching, dryness, color changes, or hair/nail changes Neurological: Denies dizziness, fainting, seizures, weakness, numbness, tingling, tremor Psychiatric - Denies nervousness, stress, depression or memory loss Endocrine: Denies heat or cold intolerance, sweating, frequent urination, excessive thirst, changes in appetite Hematologic: Denies ease of bruising or bleeding     Objective:     BP 120/72 (BP Location: Left Arm, Patient Position: Sitting, Cuff Size: Normal)   Pulse 72   Temp 98.6 F (37 C)  (Oral)   Resp 16   Ht 5\' 8"  (1.727 m)   Wt 167 lb (75.8 kg)   LMP  (LMP Unknown)   SpO2 96%   BMI 25.39 kg/m  Nursing note and vital signs reviewed.  Physical Exam  Constitutional: She is oriented to person, place, and time. She appears well-developed and well-nourished.  HENT:  Head: Normocephalic.  Right Ear: Hearing, tympanic membrane, external ear and ear canal normal.  Left Ear: Hearing, tympanic membrane, external ear and ear canal normal.  Nose: Nose normal.  Mouth/Throat: Uvula is midline, oropharynx is clear and moist and mucous membranes are normal.  Eyes: Pupils are equal, round, and reactive to light. Conjunctivae and  EOM are normal.  Neck: Neck supple. No JVD present. No tracheal deviation present. No thyromegaly present.  Cardiovascular: Normal rate, regular rhythm, normal heart sounds and intact distal pulses.   Pulmonary/Chest: Effort normal and breath sounds normal.  Abdominal: Soft. Bowel sounds are normal. She exhibits no distension and no mass. There is no tenderness. There is no rebound and no guarding.  Musculoskeletal: Normal range of motion. She exhibits no edema or tenderness.  Lymphadenopathy:    She has no cervical adenopathy.  Neurological: She is alert and oriented to person, place, and time. She has normal reflexes. No cranial nerve deficit. She exhibits normal muscle tone. Coordination normal.  Skin: Skin is warm and dry.  Psychiatric: She has a normal mood and affect. Her behavior is normal. Judgment and thought content normal.       Assessment & Plan:   Problem List Items Addressed This Visit      Other   Routine general medical examination at a health care facility - Primary    1) Anticipatory Guidance: Discussed importance of wearing a seatbelt while driving and not texting while driving; changing batteries in smoke detector at least once annually; wearing suntan lotion when outside; eating a balanced and moderate diet; getting physical  activity at least 30 minutes per day.  2) Immunizations / Screenings / Labs:  All immunizations are up-to-date per recommendations. Due for a vision screen encouraged to be completed independently. Declines hepatitis C screening. Due for cervical cancer screening and will follow-up with gynecology. Breast cancer and: cancer screening are up-to-date. All other screenings are up-to-date per recommendations. Obtain CBC, CMET. We will also obtain zinc, vitamin D, TSH, T4 free, T3 free, homocystine, C-reactive protein, and B12/folate for chronic inflammation.   Overall well exam with minimal risk factors for cardiovascular disease and exploring other systemic inflammation with nutrition. She is of good weight and following a Paleo style diet. Exercises regularly.  Continue healthy lifestyle behaviors and choices. Follow up prevention exam in 1 year. Follow up office visit pending blood work and for chronic conditions as needed.       Relevant Orders   CBC   Comprehensive metabolic panel   T4, free   TSH   T3, free   VITAMIN D 25 Hydroxy (Vit-D Deficiency, Fractures)   B12 and Folate Panel   Zinc   C-reactive protein   Homocysteine       I have discontinued Ms. Hincapie's triamcinolone cream. I am also having her maintain her Fish Oil, pantoprazole, meloxicam, cetirizine, vitamin C, QUEtiapine Fumarate, and lamoTRIgine.   Meds ordered this encounter  Medications  . lamoTRIgine (LAMICTAL) 100 MG tablet    Sig: Take 100 mg by mouth daily.     Follow-up: Return in about 1 year (around 03/03/2018), or if symptoms worsen or fail to improve.   Mauricio Po, FNP

## 2017-03-03 NOTE — Patient Instructions (Addendum)
Thank you for choosing [facility].  SUMMARY AND INSTRUCTIONS:  Please continue to take your medications as prescribed.  We will send you lab work to [facility] when we receive it.  Labs:  Please stop by the lab on the lower level of the building for your blood work. Your results will be released to Helen (or called to you) after review, usually within 72 hours after test completion. If any changes need to be made, you will be notified at that same time.  1.) The lab is open from 7:30am to 5:30 pm Monday-Friday 2.) No appointment is necessary 3.) Fasting (if needed) is 6-8 hours after food and drink; black coffee and water are okay   Follow up:  If your symptoms worsen or fail to improve, please contact our office for further instruction, or in case of emergency go directly to the emergency room at the closest medical facility.    Health Maintenance, Female Adopting a healthy lifestyle and getting preventive care can go a long way to promote health and wellness. Talk with your health care provider about what schedule of regular examinations is right for you. This is a good chance for you to check in with your provider about disease prevention and staying healthy. In between checkups, there are plenty of things you can do on your own. Experts have done a lot of research about which lifestyle changes and preventive measures are most likely to keep you healthy. Ask your health care provider for more information. Weight and diet Eat a healthy diet  Be sure to include plenty of vegetables, fruits, low-fat dairy products, and lean protein.  Do not eat a lot of foods high in solid fats, added sugars, or salt.  Get regular exercise. This is one of the most important things you can do for your health. ? Most adults should exercise for at least 150 minutes each week. The exercise should increase your heart rate and make you sweat (moderate-intensity exercise). ? Most adults should also do  strengthening exercises at least twice a week. This is in addition to the moderate-intensity exercise.  Maintain a healthy weight  Body mass index (BMI) is a measurement that can be used to identify possible weight problems. It estimates body fat based on height and weight. Your health care provider can help determine your BMI and help you achieve or maintain a healthy weight.  For females 55 years of age and older: ? A BMI below 18.5 is considered underweight. ? A BMI of 18.5 to 24.9 is normal. ? A BMI of 25 to 29.9 is considered overweight. ? A BMI of 30 and above is considered obese.  Watch levels of cholesterol and blood lipids  You should start having your blood tested for lipids and cholesterol at 55 years of age, then have this test every 5 years.  You may need to have your cholesterol levels checked more often if: ? Your lipid or cholesterol levels are high. ? You are older than 55 years of age. ? You are at high risk for heart disease.  Cancer screening Lung Cancer  Lung cancer screening is recommended for adults 55-15 years old who are at high risk for lung cancer because of a history of smoking.  A yearly low-dose CT scan of the lungs is recommended for people who: ? Currently smoke. ? Have quit within the past 15 years. ? Have at least a 30-pack-year history of smoking. A pack year is smoking an average of one  pack of cigarettes a day for 1 year.  Yearly screening should continue until it has been 15 years since you quit.  Yearly screening should stop if you develop a health problem that would prevent you from having lung cancer treatment.  Breast Cancer  Practice breast self-awareness. This means understanding how your breasts normally appear and feel.  It also means doing regular breast self-exams. Let your health care provider know about any changes, no matter how small.  If you are in your 20s or 30s, you should have a clinical breast exam (CBE) by a health  care provider every 1-3 years as part of a regular health exam.  If you are 55 or older, have a CBE every year. Also consider having a breast X-ray (mammogram) every year.  If you have a family history of breast cancer, talk to your health care provider about genetic screening.  If you are at high risk for breast cancer, talk to your health care provider about having an MRI and a mammogram every year.  Breast cancer gene (BRCA) assessment is recommended for women who have family members with BRCA-related cancers. BRCA-related cancers include: ? Breast. ? Ovarian. ? Tubal. ? Peritoneal cancers.  Results of the assessment will determine the need for genetic counseling and BRCA1 and BRCA2 testing.  Cervical Cancer Your health care provider may recommend that you be screened regularly for cancer of the pelvic organs (ovaries, uterus, and vagina). This screening involves a pelvic examination, including checking for microscopic changes to the surface of your cervix (Pap test). You may be encouraged to have this screening done every 3 years, beginning at age 55.  For women ages 55-65, health care providers may recommend pelvic exams and Pap testing every 3 years, or they may recommend the Pap and pelvic exam, combined with testing for human papilloma virus (HPV), every 5 years. Some types of HPV increase your risk of cervical cancer. Testing for HPV may also be done on women of any age with unclear Pap test results.  Other health care providers may not recommend any screening for nonpregnant women who are considered low risk for pelvic cancer and who do not have symptoms. Ask your health care provider if a screening pelvic exam is right for you.  If you have had past treatment for cervical cancer or a condition that could lead to cancer, you need Pap tests and screening for cancer for at least 20 years after your treatment. If Pap tests have been discontinued, your risk factors (such as having a new  sexual partner) need to be reassessed to determine if screening should resume. Some women have medical problems that increase the chance of getting cervical cancer. In these cases, your health care provider may recommend more frequent screening and Pap tests.  Colorectal Cancer  This type of cancer can be detected and often prevented.  Routine colorectal cancer screening usually begins at 55 years of age and continues through 55 years of age.  Your health care provider may recommend screening at an earlier age if you have risk factors for colon cancer.  Your health care provider may also recommend using home test kits to check for hidden blood in the stool.  A small camera at the end of a tube can be used to examine your colon directly (sigmoidoscopy or colonoscopy). This is done to check for the earliest forms of colorectal cancer.  Routine screening usually begins at age 12.  Direct examination of the colon should be  repeated every 5-10 years through 55 years of age. However, you may need to be screened more often if early forms of precancerous polyps or small growths are found.  Skin Cancer  Check your skin from head to toe regularly.  Tell your health care provider about any new moles or changes in moles, especially if there is a change in a mole's shape or color.  Also tell your health care provider if you have a mole that is larger than the size of a pencil eraser.  Always use sunscreen. Apply sunscreen liberally and repeatedly throughout the day.  Protect yourself by wearing long sleeves, pants, a wide-brimmed hat, and sunglasses whenever you are outside.  Heart disease, diabetes, and high blood pressure  High blood pressure causes heart disease and increases the risk of stroke. High blood pressure is more likely to develop in: ? People who have blood pressure in the high end of the normal range (130-139/85-89 mm Hg). ? People who are overweight or obese. ? People who are  African American.  If you are 17-90 years of age, have your blood pressure checked every 3-5 years. If you are 80 years of age or older, have your blood pressure checked every year. You should have your blood pressure measured twice-once when you are at a hospital or clinic, and once when you are not at a hospital or clinic. Record the average of the two measurements. To check your blood pressure when you are not at a hospital or clinic, you can use: ? An automated blood pressure machine at a pharmacy. ? A home blood pressure monitor.  If you are between 7 years and 64 years old, ask your health care provider if you should take aspirin to prevent strokes.  Have regular diabetes screenings. This involves taking a blood sample to check your fasting blood sugar level. ? If you are at a normal weight and have a low risk for diabetes, have this test once every three years after 55 years of age. ? If you are overweight and have a high risk for diabetes, consider being tested at a younger age or more often. Preventing infection Hepatitis B  If you have a higher risk for hepatitis B, you should be screened for this virus. You are considered at high risk for hepatitis B if: ? You were born in a country where hepatitis B is common. Ask your health care provider which countries are considered high risk. ? Your parents were born in a high-risk country, and you have not been immunized against hepatitis B (hepatitis B vaccine). ? You have HIV or AIDS. ? You use needles to inject street drugs. ? You live with someone who has hepatitis B. ? You have had sex with someone who has hepatitis B. ? You get hemodialysis treatment. ? You take certain medicines for conditions, including cancer, organ transplantation, and autoimmune conditions.  Hepatitis C  Blood testing is recommended for: ? Everyone born from [birth year] through 1964. ? Anyone with known risk factors for hepatitis C.  Sexually transmitted  infections (STIs)  You should be screened for sexually transmitted infections (STIs) including gonorrhea and chlamydia if: ? You are sexually active and are younger than 55 years of age. ? You are older than 55 years of age and your health care provider tells you that you are at risk for this type of infection. ? Your sexual activity has changed since you were last screened and you are at an increased risk for  chlamydia or gonorrhea. Ask your health care provider if you are at risk.  If you do not have HIV, but are at risk, it may be recommended that you take a prescription medicine daily to prevent HIV infection. This is called pre-exposure prophylaxis (PrEP). You are considered at risk if: ? You are sexually active and do not regularly use condoms or know the HIV status of your partner(s). ? You take drugs by injection. ? You are sexually active with a partner who has HIV.  Talk with your health care provider about whether you are at high risk of being infected with HIV. If you choose to begin PrEP, you should first be tested for HIV. You should then be tested every 3 months for as long as you are taking PrEP. Pregnancy  If you are premenopausal and you may become pregnant, ask your health care provider about preconception counseling.  If you may become pregnant, take 400 to 800 micrograms (mcg) of folic acid every day.  If you want to prevent pregnancy, talk to your health care provider about birth control (contraception). Osteoporosis and menopause  Osteoporosis is a disease in which the bones lose minerals and strength with aging. This can result in serious bone fractures. Your risk for osteoporosis can be identified using a bone density scan.  If you are 45 years of age or older, or if you are at risk for osteoporosis and fractures, ask your health care provider if you should be screened.  Ask your health care provider whether you should take a calcium or vitamin D supplement to lower  your risk for osteoporosis.  Menopause may have certain physical symptoms and risks.  Hormone replacement therapy may reduce some of these symptoms and risks. Talk to your health care provider about whether hormone replacement therapy is right for you. Follow these instructions at home:  Schedule regular health, dental, and eye exams.  Stay current with your immunizations.  Do not use any tobacco products including cigarettes, chewing tobacco, or electronic cigarettes.  If you are pregnant, do not drink alcohol.  If you are breastfeeding, limit how much and how often you drink alcohol.  Limit alcohol intake to no more than 1 drink per day for nonpregnant women. One drink equals 12 ounces of beer, 5 ounces of wine, or 1 ounces of hard liquor.  Do not use street drugs.  Do not share needles.  Ask your health care provider for help if you need support or information about quitting drugs.  Tell your health care provider if you often feel depressed.  Tell your health care provider if you have ever been abused or do not feel safe at home. This information is not intended to replace advice given to you by your health care provider. Make sure you discuss any questions you have with your health care provider. Document Released: 02/15/2011 Document Revised: 01/08/2016 Document Reviewed: 05/06/2015 Elsevier Interactive Patient Education  [employer].

## 2017-03-06 ENCOUNTER — Encounter: Payer: Self-pay | Admitting: Family

## 2017-03-08 ENCOUNTER — Other Ambulatory Visit: Payer: 59

## 2017-04-27 ENCOUNTER — Other Ambulatory Visit: Payer: Self-pay | Admitting: Allergy and Immunology

## 2017-04-27 ENCOUNTER — Ambulatory Visit
Admission: RE | Admit: 2017-04-27 | Discharge: 2017-04-27 | Disposition: A | Discharge status: Home / Self Care | Payer: 59 | Source: Ambulatory Visit | Attending: Allergy and Immunology | Admitting: Allergy and Immunology

## 2017-04-27 DIAGNOSIS — R05 Cough: Secondary | ICD-10-CM

## 2017-04-27 DIAGNOSIS — R059 Cough, unspecified: Secondary | ICD-10-CM

## 2017-06-21 ENCOUNTER — Ambulatory Visit: Payer: 59 | Admitting: Adult Health

## 2017-06-21 ENCOUNTER — Encounter: Payer: Self-pay | Admitting: Adult Health

## 2017-06-21 VITALS — BP 112/80 | Temp 99.3°F | Wt 166.0 lb

## 2017-06-21 DIAGNOSIS — B029 Zoster without complications: Secondary | ICD-10-CM | POA: Diagnosis not present

## 2017-06-21 MED ORDER — HYDROCODONE-ACETAMINOPHEN 10-325 MG PO TABS: 1.0000 | ORAL_TABLET | Freq: Four times a day (QID) | ORAL | 0 refills | Status: AC | PRN

## 2017-06-21 MED ORDER — VALACYCLOVIR HCL 1 G PO TABS: 1000.0000 mg | ORAL_TABLET | Freq: Three times a day (TID) | ORAL | 0 refills | Status: AC

## 2017-06-21 NOTE — Progress Notes (Signed)
Subjective:    Patient ID: Alicia Dunn, female    DOB: 11/28/1960, 56 y.o.   MRN: [id number]  HPI  56 year old female who  has a past medical history of Anxiety, ARTHRITIS, Arthritis, ASTHMA, CARDIAC ARRHYTHMIA, Cervical stenosis of spinal canal, DEPRESSION, Family history of adverse reaction to anesthesia, GERD (gastroesophageal reflux disease), HYPERLIPIDEMIA, Kidney infection, MIGRAINE HEADACHE, RHINOPLASTY, HX OF, and UTI'S, HX OF.  She is a patient of Terri Piedra who I am seeing today to the first time for an acute issue of painful rash on her right buttock. She reports first notice the rash 2-3 days ago, at which time it was " just a pimple". As the week progressed the rash became larger and more painful. No she endorses small vesicles but has not noticed any drainage or discharge   Review of Systems See HPI   Past Medical History:  Diagnosis Date  . Anxiety    + panic attacks in past   . ARTHRITIS   . Arthritis    OA, hip, back- lumbar spine   . ASTHMA    has Dulera for PRN use, not used in past month   . CARDIAC ARRHYTHMIA   . Cervical stenosis of spinal canal   . DEPRESSION   . Family history of adverse reaction to anesthesia    N&V- Mother of pt.   Marland Kitchen GERD (gastroesophageal reflux disease)   . HYPERLIPIDEMIA   . Kidney infection    2010 , perhaps, unsure of date  . MIGRAINE HEADACHE    Zomig nasal spray, but hasn't used in long time  . RHINOPLASTY, HX OF   . UTI'S, HX OF     Social History   Socioeconomic History  . Marital status: Single    Spouse name: Not on file  . Number of children: 1  . Years of education: 45  . Highest education level: Not on file  Social Needs  . Financial resource strain: Not on file  . Food insecurity - worry: Not on file  . Food insecurity - inability: Not on file  . Transportation needs - medical: Not on file  . Transportation needs - non-medical: Not on file  Occupational History  . Occupation: Freight forwarder  Tobacco Use  .  Smoking status: Never Smoker  . Smokeless tobacco: Never Used  . Tobacco comment: separated 01/2013. 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 Alcohol use: Yes    Alcohol/week: 0.6 - 1.2 oz    Types: 1 - 2 Glasses of wine per week  . Drug use: No  . Sexual activity: Not on file  Other Topics Concern  . Not on file  Social History Narrative   Fun: Travel, eat, cook, read    Past Surgical History:  Procedure Laterality Date  . arthroscopic labral repair Left 11/2007  . JOINT REPLACEMENT Right 2011   hip  . OPEN REPAIR SPONTANEOUS DISLOCATION HIP  03/2008   @ [facility]  . RHINOPLASTY  1981  . Right Arthroscopic labral repair  07/2008  . SPINAL FUSION  1986   Harrington rod T5-L3    Family History  Problem Relation Age of Onset  . Arthritis Mother   . Hyperlipidemia Mother   . Arthritis Other   . Colon cancer Other   . Diabetes Other        grandparent  . Lung cancer Other     Allergies  Allergen Reactions  .  Augmentin [Amoxicillin-Pot Clavulanate] Rash and Nausea Only    + abdominal cramping   . Scopolamine Other (See Comments)    Other Reaction: Migraines  . Oxycodone-Acetaminophen Other (See Comments)    Hypersensitive, /w itching   . Iohexol      Desc: PREMED WITH BENADRYL, only oral contrast /w CAT, not MRI contrast    . Morphine     hallucinations  . Tramadol Other (See Comments)    Other Reaction: hallucinations, sweating    Current Outpatient Medications on File Prior to Visit  Medication Sig Dispense Refill  . Ascorbic Acid (VITAMIN C) 1000 MG tablet Take 1,000 mg by mouth daily.    . fexofenadine (ALLEGRA) 180 MG tablet Take 180 mg daily by mouth.    . gabapentin (NEURONTIN) 300 MG capsule TK 1 C PO QHS  1  . ipratropium (ATROVENT) 0.06 % nasal spray SPRAY 2 SPRAYS IN EACH NOSTRIL BID PRN FOR DRAINAGE  5  . lamoTRIgine (LAMICTAL) 100 MG tablet Take 100 mg by mouth daily.    . meloxicam (MOBIC)  15 MG tablet Take 1 tablet (15 mg total) by mouth daily. 30 tablet 0  . mometasone-formoterol (DULERA) 100-5 MCG/ACT AERO Inhale 2 puffs 2 (two) times daily into the lungs.    . Omega-3 Fatty Acids (FISH OIL) 1000 MG CAPS Take by mouth daily.      . pantoprazole (PROTONIX) 40 MG tablet Take 1 tablet (40 mg total) by mouth daily. (Patient taking differently: Take 40 mg by mouth daily before breakfast. ) 90 tablet 3  . QUEtiapine Fumarate (SEROQUEL XR) 150 MG 24 hr tablet Take 1 tablet (150 mg total) by mouth at bedtime. 90 tablet 1  . ranitidine (ZANTAC) 150 MG capsule TK 1 C PO QHS  0   No current facility-administered medications on file prior to visit.     BP 112/80 (BP Location: Left Arm)   Temp 99.3 F (37.4 C) (Oral)   Wt 166 lb (75.3 kg)   LMP  (LMP Unknown)   BMI 25.24 kg/m       Objective:   Physical Exam  Constitutional: She is oriented to person, place, and time. She appears well-developed and well-nourished. No distress.  Cardiovascular: Normal rate, regular rhythm, normal heart sounds and intact distal pulses. Exam reveals no gallop and no friction rub.  No murmur heard. Neurological: She is alert and oriented to person, place, and time.  Skin: Skin is warm and dry. Rash noted. She is not diaphoretic. There is erythema.  erythemic dermatomal rash across right buttock. No active drainage noted.   Psychiatric: She has a normal mood and affect. Her behavior is normal. Thought content normal.  Nursing note and vitals reviewed.     Assessment & Plan:  1. Herpes zoster without complication - Exam consistent with shingles. Will prescribe Valtrex 1000 mg TID and Norco for break through pain. Take tylnenol or motrin Q6-8 hours - valACYclovir (VALTREX) 1000 MG tablet; Take 1 tablet (1,000 mg total) 3 (three) times daily for 10 days by mouth.  Dispense: 30 tablet; Refill: 0 - HYDROcodone-acetaminophen (NORCO) 10-325 MG tablet; Take 1 tablet every 6 (six) hours as needed for up to  3 days by mouth.  Dispense: 12 tablet; Refill: 0 - Follow up as needed  Dorothyann Peng, NP

## 2017-06-22 ENCOUNTER — Other Ambulatory Visit: Payer: Self-pay | Admitting: Rehabilitation

## 2017-06-22 DIAGNOSIS — M47816 Spondylosis without myelopathy or radiculopathy, lumbar region: Secondary | ICD-10-CM

## 2017-06-23 ENCOUNTER — Other Ambulatory Visit: Payer: Self-pay | Admitting: Adult Health

## 2017-06-23 ENCOUNTER — Telehealth: Payer: Self-pay | Admitting: Family Medicine

## 2017-06-23 MED ORDER — ACYCLOVIR 800 MG PO TABS: 800.0000 mg | ORAL_TABLET | Freq: Every day | ORAL | 0 refills | Status: DC

## 2017-06-23 NOTE — Telephone Encounter (Signed)
Received a fax from [facility] on [address]  Noberto Retort is not covered by patient's insurance.  Needs to be changed to acyclovir tabs.  Please advise new rx.  Thanks!!!

## 2017-06-23 NOTE — Telephone Encounter (Signed)
Acyclovir 800 mg x 5 times a day x 7 days called into pharmacy

## 2017-06-24 ENCOUNTER — Ambulatory Visit: Payer: 59 | Admitting: Adult Health

## 2017-06-24 ENCOUNTER — Encounter: Payer: Self-pay | Admitting: Adult Health

## 2017-06-24 VITALS — BP 100/70 | Temp 98.5°F | Ht 68.0 in | Wt 167.0 lb

## 2017-06-24 DIAGNOSIS — B029 Zoster without complications: Secondary | ICD-10-CM | POA: Diagnosis not present

## 2017-06-24 DIAGNOSIS — Z7689 Persons encountering health services in other specified circumstances: Secondary | ICD-10-CM | POA: Diagnosis not present

## 2017-06-24 DIAGNOSIS — F329 Major depressive disorder, single episode, unspecified: Secondary | ICD-10-CM

## 2017-06-24 DIAGNOSIS — F419 Anxiety disorder, unspecified: Secondary | ICD-10-CM

## 2017-06-24 NOTE — Progress Notes (Signed)
Patient presents to clinic today to establish care. She is a pleasant 57 year old female who  has a past medical history of Anxiety, Arthritis, ASTHMA, CARDIAC ARRHYTHMIA, Cervical stenosis of spinal canal, DEPRESSION, Family history of adverse reaction to anesthesia, GERD (gastroesophageal reflux disease), HYPERLIPIDEMIA, MIGRAINE HEADACHE, Myelosis ([place]), RHINOPLASTY, HX OF, and UTI'S, HX OF.  She is a former patient of Terri Piedra, who is establishing with me   Her last CPE was in July 2018   Acute Concerns: Establish Care   Chronic Issues: Anxiety and Depression - She takes Seraquel and Lamictal - stable.   GERD - takes Protonix and Zantac- she feels as though this is starting to work to control her symptoms    Health Maintenance: Dental -- Routine Care  Vision -- Routine Care  Immunizations -- UTD  Colonoscopy -- 2016 - 10 year  Mammogram -- UTD PAP -- UTD   She is followed by  1. Allergy/Asthma  2. Orthopedics   3. GYN - Dr. Lisbeth Renshaw   Past Medical History:  Diagnosis Date  . Anxiety    + panic attacks in past   . Arthritis    OA, hip, back- lumbar spine   . ASTHMA    has Dulera for PRN use, not used in past month   . CARDIAC ARRHYTHMIA   . Cervical stenosis of spinal canal   . DEPRESSION   . Family history of adverse reaction to anesthesia    N&V- Mother of pt.   Marland Kitchen GERD (gastroesophageal reflux disease)   . HYPERLIPIDEMIA   . MIGRAINE HEADACHE    Zomig nasal spray, but hasn't used in long time  . Myelosis ([place])   . RHINOPLASTY, HX OF   . UTI'S, HX OF     Past Surgical History:  Procedure Laterality Date  . arthroscopic labral repair Left 11/2007  . JOINT REPLACEMENT Right 2011   hip  . OPEN REPAIR SPONTANEOUS DISLOCATION HIP  03/2008   @ [facility]  . RHINOPLASTY  1981  . Right Arthroscopic labral repair  07/2008  . SPINAL FUSION  1986   Harrington rod T5-L3    Current Outpatient Medications on File Prior to Visit  Medication Sig Dispense Refill  .  Ascorbic Acid (VITAMIN C) 1000 MG tablet Take 1,000 mg by mouth daily.    . fexofenadine (ALLEGRA) 180 MG tablet Take 180 mg daily by mouth.    . gabapentin (NEURONTIN) 300 MG capsule TK 1 C PO QHS  1  . HYDROcodone-acetaminophen (NORCO) 10-325 MG tablet Take 1 tablet every 6 (six) hours as needed for up to 3 days by mouth. 12 tablet 0  . ipratropium (ATROVENT) 0.06 % nasal spray SPRAY 2 SPRAYS IN EACH NOSTRIL BID PRN FOR DRAINAGE  5  . lamoTRIgine (LAMICTAL) 100 MG tablet Take 100 mg by mouth daily.    . meloxicam (MOBIC) 15 MG tablet Take 1 tablet (15 mg total) by mouth daily. 30 tablet 0  . mometasone-formoterol (DULERA) 100-5 MCG/ACT AERO Inhale 2 puffs 2 (two) times daily into the lungs.    . Omega-3 Fatty Acids (FISH OIL) 1000 MG CAPS Take by mouth daily.      . pantoprazole (PROTONIX) 40 MG tablet Take 1 tablet (40 mg total) by mouth daily. (Patient taking differently: Take 40 mg by mouth daily before breakfast. ) 90 tablet 3  . QUEtiapine Fumarate (SEROQUEL XR) 150 MG 24 hr tablet Take 1 tablet (150 mg total) by mouth at bedtime. 90 tablet  1  . ranitidine (ZANTAC) 150 MG capsule TK 1 C PO QHS  0  . valACYclovir (VALTREX) 1000 MG tablet Take 1 tablet (1,000 mg total) 3 (three) times daily for 10 days by mouth. 30 tablet 0   No current facility-administered medications on file prior to visit.     Allergies  Allergen Reactions  . Augmentin [Amoxicillin-Pot Clavulanate] Rash and Nausea Only    + abdominal cramping   . Scopolamine Other (See Comments)    Other Reaction: Migraines  . Oxycodone-Acetaminophen Other (See Comments)    Hypersensitive, /w itching   . Iohexol      Desc: PREMED WITH BENADRYL, only oral contrast /w CAT, not MRI contrast    . Morphine     hallucinations  . Tramadol Other (See Comments)    Other Reaction: hallucinations, sweating    Family History  Problem Relation Age of Onset  . Arthritis Mother   . Hyperlipidemia Mother   . Arthritis Other   . Colon  cancer Other   . Diabetes Other        grandparent  . Lung cancer Other   . Arthritis Father        OA and RA   . Arthritis Sister   . Ehlers-Danlos syndrome Sister   . Arthritis Brother     Social History   Socioeconomic History  . Marital status: Single    Spouse name: Not on file  . Number of children: 1  . Years of education: 63  . Highest education level: Not on file  Social Needs  . Financial resource strain: Not on file  . Food insecurity - worry: Not on file  . Food insecurity - inability: Not on file  . Transportation needs - medical: Not on file  . Transportation needs - non-medical: Not on file  Occupational History  . Occupation: Freight forwarder  Tobacco Use  . Smoking status: Never Smoker  . Smokeless tobacco: Never Used  . Tobacco comment: separated 01/2013. 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 Alcohol use: Yes    Alcohol/week: 0.6 - 1.2 oz    Types: 1 - 2 Glasses of wine per week  . Drug use: No  . Sexual activity: Not on file  Other Topics Concern  . Not on file  Social History Narrative   Freight forwarder for [employer]    Not married    53 year old daughter          Fun: Travel, eat, cook, read    Review of Systems  Constitutional: Negative.   Cardiovascular: Negative.   Musculoskeletal: Positive for back pain and myalgias.  Psychiatric/Behavioral: Positive for depression. The patient is nervous/anxious.   All other systems reviewed and are negative.   BP 100/70   Temp 98.5 F (36.9 C) (Oral)   Ht 5\' 8"  (1.727 m)   Wt 167 lb (75.8 kg)   LMP  (LMP Unknown)   BMI 25.39 kg/m   Physical Exam  Constitutional: She is oriented to person, place, and time and well-developed, well-nourished, and in no distress. No distress.  Cardiovascular: Normal rate, normal heart sounds and intact distal pulses.  Pulmonary/Chest: Effort normal and breath sounds normal. No respiratory distress. She has no  wheezes. She has no rales. She exhibits no tenderness.  Neurological: She is alert and oriented to person, place, and time. Gait normal. GCS score is 15.  Skin: Skin is warm and  dry. Rash (shingles rash on right buttock ) noted. She is not diaphoretic. No erythema. No pallor.  Psychiatric: Mood, memory, affect and judgment normal.  Nursing note and vitals reviewed.   Assessment/Plan:  1. Encounter to establish care - Follow up in July 2019 for CPE  - Follow up sooner if needed   2. Anxiety and depression - Well controlled.  - Will prescribe Seroquel and Lamictal as needed   3. Herpes zoster without complication - Continue with Valtrex and Tylenol/Motrin as needed - she has a prescription for Norco to take for break through pain   Dorothyann Peng, NP

## 2017-07-02 ENCOUNTER — Ambulatory Visit
Admission: RE | Admit: 2017-07-02 | Discharge: 2017-07-02 | Disposition: A | Discharge status: Home / Self Care | Payer: 59 | Source: Ambulatory Visit | Attending: Rehabilitation | Admitting: Rehabilitation

## 2017-07-02 DIAGNOSIS — M47816 Spondylosis without myelopathy or radiculopathy, lumbar region: Secondary | ICD-10-CM

## 2017-11-17 ENCOUNTER — Other Ambulatory Visit: Payer: Self-pay | Admitting: Orthopedic Surgery

## 2017-11-17 DIAGNOSIS — M47896 Other spondylosis, lumbar region: Secondary | ICD-10-CM

## 2017-12-13 ENCOUNTER — Other Ambulatory Visit: Payer: Self-pay | Admitting: Orthopedic Surgery

## 2017-12-13 ENCOUNTER — Ambulatory Visit
Admission: RE | Admit: 2017-12-13 | Discharge: 2017-12-13 | Disposition: A | Discharge status: Home / Self Care | Payer: 59 | Source: Ambulatory Visit | Attending: Orthopedic Surgery | Admitting: Orthopedic Surgery

## 2017-12-13 DIAGNOSIS — M4135 Thoracogenic scoliosis, thoracolumbar region: Secondary | ICD-10-CM

## 2017-12-13 DIAGNOSIS — M5136 Other intervertebral disc degeneration, lumbar region: Secondary | ICD-10-CM

## 2017-12-19 ENCOUNTER — Other Ambulatory Visit: Payer: 59

## 2018-02-27 LAB — HEMOGLOBIN A1C: HEMOGLOBIN A1C: 5.4

## 2018-02-27 LAB — BASIC METABOLIC PANEL
BUN: 23 — AB (ref 4–21)
CREATININE: 0.9 (ref 0.5–1.1)
Glucose: 83
Potassium: 4.5 (ref 3.4–5.3)
Sodium: 143 (ref 137–147)

## 2018-02-27 LAB — LIPID PANEL
Cholesterol: 338 — AB (ref 0–200)
HDL: 60 (ref 35–70)
LDL Cholesterol: 227
TRIGLYCERIDES: 255 — AB (ref 40–160)

## 2018-02-27 LAB — TSH: TSH: 4.05 (ref 0.41–5.90)

## 2018-02-27 LAB — HEPATIC FUNCTION PANEL
ALK PHOS: 86 (ref 25–125)
ALT: 23 (ref 7–35)
AST: 18 (ref 13–35)
Bilirubin, Total: 0.4

## 2018-02-27 LAB — CBC AND DIFFERENTIAL
HCT: 48 — AB (ref 36–46)
Hemoglobin: 15.9 (ref 12.0–16.0)
NEUTROS ABS: 4
WBC: 7.2

## 2018-03-06 ENCOUNTER — Encounter: Payer: 59 | Admitting: Family

## 2018-03-06 ENCOUNTER — Encounter: Payer: 59 | Admitting: Nurse Practitioner

## 2018-03-07 ENCOUNTER — Encounter: Payer: 59 | Admitting: Adult Health

## 2018-03-14 ENCOUNTER — Ambulatory Visit (INDEPENDENT_AMBULATORY_CARE_PROVIDER_SITE_OTHER): Payer: 59 | Admitting: Adult Health

## 2018-03-14 ENCOUNTER — Encounter: Payer: Self-pay | Admitting: Adult Health

## 2018-03-14 VITALS — BP 118/80 | Temp 98.3°F | Ht 68.0 in | Wt 163.0 lb

## 2018-03-14 DIAGNOSIS — F32A Depression, unspecified: Secondary | ICD-10-CM

## 2018-03-14 DIAGNOSIS — E782 Mixed hyperlipidemia: Secondary | ICD-10-CM

## 2018-03-14 DIAGNOSIS — Z0000 Encounter for general adult medical examination without abnormal findings: Secondary | ICD-10-CM | POA: Diagnosis not present

## 2018-03-14 DIAGNOSIS — E559 Vitamin D deficiency, unspecified: Secondary | ICD-10-CM

## 2018-03-14 DIAGNOSIS — G894 Chronic pain syndrome: Secondary | ICD-10-CM | POA: Diagnosis not present

## 2018-03-14 DIAGNOSIS — F329 Major depressive disorder, single episode, unspecified: Secondary | ICD-10-CM

## 2018-03-14 DIAGNOSIS — F419 Anxiety disorder, unspecified: Secondary | ICD-10-CM

## 2018-03-14 LAB — CBC WITH DIFFERENTIAL/PLATELET
Basophils Absolute: 0 10*3/uL (ref 0.0–0.1)
Basophils Relative: 0.3 % (ref 0.0–3.0)
EOS ABS: 0.1 10*3/uL (ref 0.0–0.7)
EOS PCT: 1.1 % (ref 0.0–5.0)
HEMATOCRIT: 47.8 % — AB (ref 36.0–46.0)
HEMOGLOBIN: 16.3 g/dL — AB (ref 12.0–15.0)
LYMPHS PCT: 29 % (ref 12.0–46.0)
Lymphs Abs: 1.8 10*3/uL (ref 0.7–4.0)
MCHC: 34.1 g/dL (ref 30.0–36.0)
MCV: 95.2 fl (ref 78.0–100.0)
Monocytes Absolute: 0.3 10*3/uL (ref 0.1–1.0)
Monocytes Relative: 5.5 % (ref 3.0–12.0)
Neutro Abs: 4 10*3/uL (ref 1.4–7.7)
Neutrophils Relative %: 64.1 % (ref 43.0–77.0)
Platelets: 264 10*3/uL (ref 150.0–400.0)
RBC: 5.02 Mil/uL (ref 3.87–5.11)
RDW: 15.2 % (ref 11.5–15.5)
WBC: 6.2 10*3/uL (ref 4.0–10.5)

## 2018-03-14 LAB — HEPATIC FUNCTION PANEL
ALT: 17 U/L (ref 0–35)
AST: 16 U/L (ref 0–37)
Albumin: 4.6 g/dL (ref 3.5–5.2)
Alkaline Phosphatase: 86 U/L (ref 39–117)
BILIRUBIN DIRECT: 0 mg/dL (ref 0.0–0.3)
TOTAL PROTEIN: 6.8 g/dL (ref 6.0–8.3)
Total Bilirubin: 0.4 mg/dL (ref 0.2–1.2)

## 2018-03-14 LAB — VITAMIN D 25 HYDROXY (VIT D DEFICIENCY, FRACTURES): VITD: 43.79 ng/mL (ref 30.00–100.00)

## 2018-03-14 LAB — BASIC METABOLIC PANEL
BUN: 20 mg/dL (ref 6–23)
CHLORIDE: 103 meq/L (ref 96–112)
CO2: 31 mEq/L (ref 19–32)
Calcium: 10.1 mg/dL (ref 8.4–10.5)
Creatinine, Ser: 0.85 mg/dL (ref 0.40–1.20)
GFR: 73.5 mL/min (ref >60.00)
Glucose, Bld: 79 mg/dL (ref 70–99)
POTASSIUM: 5.1 meq/L (ref 3.5–5.1)
Sodium: 142 mEq/L (ref 135–145)

## 2018-03-14 LAB — TSH: TSH: 1.4 u[IU]/mL (ref 0.35–4.50)

## 2018-03-14 LAB — T4, FREE: FREE T4: 0.68 ng/dL (ref 0.60–1.60)

## 2018-03-14 NOTE — Progress Notes (Signed)
Subjective:    Patient ID: Alicia Dunn, female    DOB: 09-08-1960, 57 y.o.   MRN: [id number]  HPI Patient presents for yearly preventative medicine examination. She is a pleasant 57 year old female who  has a past medical history of Anxiety, Arthritis, ASTHMA, CARDIAC ARRHYTHMIA, Cervical stenosis of spinal canal, DEPRESSION, Family history of adverse reaction to anesthesia, GERD (gastroesophageal reflux disease), HYPERLIPIDEMIA, MIGRAINE HEADACHE, Myelosis ([place]), RHINOPLASTY, HX OF, and UTI'S, HX OF.  Anxiety and Depression - is seen by psychiatry. Has stopped taking Lamictal and is working with psychiatrist to wean off Seroquel.   Hyperlipidemia -had labs drawn by psychiatry on February 27, 2018.  These labs will be abstracted into the chart but total cholesterol was 338, triglycerides 255 and LDL 227.  She does not want to go on a statin at this time.  She is hoping getting off Seroquel will help her cholesterol panel.  Chronic Pain Syndrome -she has been working with a functional and integrative dietitian to help with her chronic pain syndrome.  We have presents to the office today with a list of labs that the dietitian would like to have drawn.  All immunizations and health maintenance protocols were reviewed with the patient and needed orders were placed. She is UTD on vaccinations   Appropriate screening laboratory values were ordered for the patient including screening of hyperlipidemia, renal function and hepatic function.  Medication reconciliation,  past medical history, social history, problem list and allergies were reviewed in detail with the patient  Goals were established with regard to weight loss, exercise, and  diet in compliance with medications  She is seen by GYN and is up-to-date on her mammogram and Pap smear.  She is also up-to-date on routine colonoscopies.  Review of Systems  Constitutional: Negative.   HENT: Negative.   Eyes: Negative.   Respiratory: Negative.     Cardiovascular: Negative.   Gastrointestinal: Negative.   Endocrine: Negative.   Genitourinary: Negative.   Musculoskeletal: Positive for arthralgias, back pain and myalgias.  Allergic/Immunologic: Negative.   Neurological: Negative.   Psychiatric/Behavioral: Positive for decreased concentration. Negative for sleep disturbance. The patient is nervous/anxious.   All other systems reviewed and are negative.    Past Medical History:  Diagnosis Date  . Anxiety    + panic attacks in past   . Arthritis    OA, hip, back- lumbar spine   . ASTHMA    has Dulera for PRN use, not used in past month   . CARDIAC ARRHYTHMIA   . Cervical stenosis of spinal canal   . DEPRESSION   . Family history of adverse reaction to anesthesia    N&V- Mother of pt.   Marland Kitchen GERD (gastroesophageal reflux disease)   . HYPERLIPIDEMIA   . MIGRAINE HEADACHE    Zomig nasal spray, but hasn't used in long time  . Myelosis ([place])   . RHINOPLASTY, HX OF   . UTI'S, HX OF     Social History   Socioeconomic History  . Marital status: Single    Spouse name: Not on file  . Number of children: 1  . Years of education: 47  . Highest education level: Not on file  Occupational History  . Occupation: Freight forwarder  Social Needs  . Financial resource strain: Not on file  . Food insecurity:    Worry: Not on file    Inability: Not on file  . Transportation needs:    Medical: Not on file  Non-medical: Not on file  Tobacco Use  . Smoking status: Never Smoker  . Smokeless tobacco: Never Used  . Tobacco comment: separated 01/2013. 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 Alcohol use: Yes    Alcohol/week: 0.6 - 1.2 oz    Types: 1 - 2 Glasses of wine per week  . Drug use: No  . Sexual activity: Not on file  Lifestyle  . Physical activity:    Days per week: Not on file    Minutes per session: Not on file  . Stress: Not on file  Relationships  . Social  connections:    Talks on phone: Not on file    Gets together: Not on file    Attends religious service: Not on file    Active member of club or organization: Not on file    Attends meetings of clubs or organizations: Not on file    Relationship status: Not on file  . Intimate partner violence:    Fear of current or ex partner: Not on file    Emotionally abused: Not on file    Physically abused: Not on file    Forced sexual activity: Not on file  Other Topics Concern  . Not on file  Social History Narrative   Freight forwarder for [employer]    Not married    17 year old daughter          Fun: Travel, eat, cook, read    Past Surgical History:  Procedure Laterality Date  . arthroscopic labral repair Left 11/2007  . JOINT REPLACEMENT Right 2011   hip  . OPEN REPAIR SPONTANEOUS DISLOCATION HIP  03/2008   @ [facility]  . RHINOPLASTY  1981  . Right Arthroscopic labral repair  07/2008  . SPINAL FUSION  1986   Harrington rod T5-L3  . TOTAL HIP REVISION Right 08/25/2015   Procedure: RIGHT TOTAL HIP REVISION, ORIF PERI-PROSTHETIC FRACTURE;  Surgeon: Frederik Pear, MD;  Location: [facility];  Service: Orthopedics;  Laterality: Right;    Family History  Problem Relation Age of Onset  . Arthritis Mother   . Hyperlipidemia Mother   . Arthritis Other   . Colon cancer Other   . Diabetes Other        grandparent  . Lung cancer Other   . Arthritis Father        OA and RA   . Arthritis Sister   . Ehlers-Danlos syndrome Sister   . Arthritis Brother     Allergies  Allergen Reactions  . Augmentin [Amoxicillin-Pot Clavulanate] Rash and Nausea Only    + abdominal cramping   . Scopolamine Other (See Comments)    Other Reaction: Migraines  . Oxycodone-Acetaminophen Other (See Comments)    Hypersensitive, /w itching   . Iohexol      Desc: PREMED WITH BENADRYL, only oral contrast /w CAT, not MRI contrast    . Morphine     hallucinations  . Tramadol Other (See Comments)    Other Reaction:  hallucinations, sweating    Current Outpatient Medications on File Prior to Visit  Medication Sig Dispense Refill  . carisoprodol (SOMA) 350 MG tablet Take 350 mg by mouth at bedtime.    Marland Kitchen HYDROcodone-acetaminophen (NORCO) 10-325 MG tablet Take 1 tablet by mouth every 6 (six) hours as needed.    . meloxicam (MOBIC) 15 MG tablet Take 1 tablet (15 mg total) by mouth daily. 30 tablet 0  .  Omega-3 Fatty Acids (FISH OIL) 1000 MG CAPS Take by mouth daily.      . pantoprazole (PROTONIX) 40 MG tablet Take 1 tablet (40 mg total) by mouth daily. (Patient taking differently: Take 40 mg by mouth daily before breakfast. ) 90 tablet 3  . QUEtiapine Fumarate (SEROQUEL XR) 150 MG 24 hr tablet Take 1 tablet (150 mg total) by mouth at bedtime. 90 tablet 1  . ranitidine (ZANTAC) 150 MG capsule TK 1 C PO QHS  0   No current facility-administered medications on file prior to visit.     BP 118/80   Temp 98.3 F (36.8 C) (Oral)   Ht 5\' 8"  (1.727 m)   Wt 163 lb (73.9 kg)   LMP  (LMP Unknown)   BMI 24.78 kg/m       Objective:   Physical Exam  Constitutional: She is oriented to person, place, and time. She appears well-developed and well-nourished. No distress.  HENT:  Head: Normocephalic and atraumatic.  Right Ear: External ear normal.  Left Ear: External ear normal.  Nose: Nose normal.  Mouth/Throat: Oropharynx is clear and moist. No oropharyngeal exudate.  Eyes: Pupils are equal, round, and reactive to light. Conjunctivae and EOM are normal. Right eye exhibits no discharge. Left eye exhibits no discharge. No scleral icterus.  Neck: Normal range of motion. Neck supple. No JVD present. No tracheal deviation present. No thyromegaly present.  Cardiovascular: Normal rate, regular rhythm, normal heart sounds and intact distal pulses. Exam reveals no gallop and no friction rub.  No murmur heard. Pulmonary/Chest: Effort normal and breath sounds normal. No stridor. No respiratory distress. She has no  wheezes. She has no rales. She exhibits no tenderness.  Abdominal: Soft. Bowel sounds are normal. She exhibits no distension and no mass. There is no tenderness. There is no rebound and no guarding. No hernia.  Genitourinary:  Genitourinary Comments: Done by GYN   Musculoskeletal: Normal range of motion. She exhibits no edema, tenderness or deformity.  Lymphadenopathy:    She has no cervical adenopathy.  Neurological: She is alert and oriented to person, place, and time. She displays normal reflexes. No cranial nerve deficit or sensory deficit. She exhibits normal muscle tone. Coordination normal.  Skin: Skin is warm and dry. Capillary refill takes less than 2 seconds. No rash noted. She is not diaphoretic. No erythema. No pallor.  Psychiatric: She has a normal mood and affect. Her behavior is normal. Judgment and thought content normal.  Nursing note and vitals reviewed.     Assessment & Plan:  1. Routine general medical examination at a health care facility -We will scan and labs from 02/27/2018. -Follow up in 1 year or sooner if needed -Encouraged heart healthy diet and frequent exercise - Basic metabolic panel - CBC with Differential/Platelet - Hepatic function panel - TSH - T4, Free - Thyroid Peroxidase Antibody - Iron, TIBC and Ferritin Panel - Vitamin D, 25-hydroxy  2. Chronic pain syndrome -Advised patient that I do not feel comfortable ordering all the labs that her integrative dietitian would like me to order.  Her dietitian can refer her to integrative medicine practitioner if she would like. - Thyroid Peroxidase Antibody - Iron, TIBC and Ferritin Panel  3. Anxiety and depression -Continue with plan of care by psychiatry  4. Mixed hyperlipidemia -[place]hl panel high per labs on 02/27/2018.  She refuses statin at this time.  We will continue to monitor  5. Vitamin D deficiency  - Vitamin D, 25-hydroxy  Tommi Rumps  Addiel Mccardle, NP

## 2018-03-15 LAB — IRON,TIBC AND FERRITIN PANEL
%SAT: 34 % (calc) (ref 16–45)
FERRITIN: 157 ng/mL (ref 16–232)
IRON: 107 ug/dL (ref 45–160)
TIBC: 318 ug/dL (ref 250–450)

## 2018-03-15 LAB — THYROID PEROXIDASE ANTIBODY: THYROID PEROXIDASE ANTIBODY: 1 [IU]/mL (ref <9)

## 2018-03-16 ENCOUNTER — Telehealth: Payer: Self-pay | Admitting: Adult Health

## 2018-03-16 NOTE — Telephone Encounter (Signed)
Patient returning call Copied from [place] [id number]. Topic: Quick Communication - Lab Results >> Mar 15, 2018  4:49 PM Miles Costain T, [place] wrote: Called patient to inform them of all lab results from 03/14/18 . When patient returns call, triage nurse may disclose results.

## 2018-03-16 NOTE — Telephone Encounter (Signed)
Pt given results and documented in result note 

## 2018-03-22 ENCOUNTER — Encounter: Payer: Self-pay | Admitting: Family Medicine

## 2018-04-04 LAB — TSH: TSH: 4.63 (ref 0.41–5.90)

## 2018-04-18 ENCOUNTER — Encounter: Payer: Self-pay | Admitting: Adult Health

## 2018-04-18 ENCOUNTER — Telehealth: Payer: Self-pay | Admitting: Family Medicine

## 2018-04-18 ENCOUNTER — Encounter: Payer: Self-pay | Admitting: Family Medicine

## 2018-04-18 NOTE — Telephone Encounter (Signed)
Copied from [place] [id number]. Topic: Referral - Request >> Apr 18, 2018 12:11 PM Berneta Levins wrote: Reason for CRM:   [facility] from [facility] calling in: states that pt has called them requesting an appointment with Dr. Suzette Battiest.  States that they have lab results but they need office notes. Fax:  [phone] Phones:  [phone]

## 2018-04-18 NOTE — Telephone Encounter (Signed)
Spoke to the pt and informed her that she will need to sign a medical record release in order for those records to be sent over.  Pt agreed to come sign.  No further action required.

## 2021-06-10 ENCOUNTER — Ambulatory Visit: Payer: 59 | Admitting: Adult Health

## 2022-01-20 ENCOUNTER — Other Ambulatory Visit: Payer: Self-pay | Admitting: Rehabilitation

## 2022-01-20 DIAGNOSIS — M5116 Intervertebral disc disorders with radiculopathy, lumbar region: Secondary | ICD-10-CM

## 2022-01-27 ENCOUNTER — Ambulatory Visit
Admission: RE | Admit: 2022-01-27 | Discharge: 2022-01-27 | Disposition: A | Discharge status: Home / Self Care | Payer: 59 | Source: Ambulatory Visit | Attending: Rehabilitation | Admitting: Rehabilitation

## 2022-01-27 DIAGNOSIS — M5116 Intervertebral disc disorders with radiculopathy, lumbar region: Secondary | ICD-10-CM

## 2022-02-04 ENCOUNTER — Other Ambulatory Visit: Payer: 59

## 2022-09-07 ENCOUNTER — Encounter: Payer: Self-pay | Admitting: Adult Health

## 2022-09-07 ENCOUNTER — Ambulatory Visit: Payer: 59 | Admitting: Adult Health

## 2022-09-07 VITALS — BP 110/82 | HR 85 | Temp 98.4°F | Ht 68.0 in | Wt 179.0 lb

## 2022-09-07 DIAGNOSIS — R300 Dysuria: Secondary | ICD-10-CM

## 2022-09-07 LAB — POCT URINALYSIS DIPSTICK
Bilirubin, UA: NEGATIVE
Blood, UA: POSITIVE
Glucose, UA: NEGATIVE
Ketones, UA: NEGATIVE
Nitrite, UA: POSITIVE
Protein, UA: POSITIVE — AB
Urobilinogen, UA: 1 E.U./dL

## 2022-09-07 MED ORDER — FLUCONAZOLE 150 MG PO TABS: 150.0000 mg | ORAL_TABLET | Freq: Every day | ORAL | 1 refills | Status: DC

## 2022-09-07 NOTE — Patient Instructions (Addendum)
I am going to send your urine out of a culture and will update you when we get it back   Please follow up for your physical exam

## 2022-09-07 NOTE — Progress Notes (Signed)
Subjective:    Patient ID: Alicia Dunn, female    DOB: 03/23/1961, 62 y.o.   MRN: [id number]  Urinary Frequency    62 year old female who  has a past medical history of Anxiety, Arthritis, ASTHMA, CARDIAC ARRHYTHMIA, Cervical stenosis of spinal canal, DEPRESSION, Family history of adverse reaction to anesthesia, GERD (gastroesophageal reflux disease), HYPERLIPIDEMIA, MIGRAINE HEADACHE, Myelosis ([place]), RHINOPLASTY, HX OF, and UTI'S, HX OF.  She presents to the office today for concern of a UTI. Her symptoms have been present for about a week and a half. She was seen by [facility] who prescribed her a 5 day course of Bactrim, this caused a lot of GI issues and a few after finishing her abx treatment her symptoms came back. She denies fevers or chills but has had dysuria, hematuria, and dysuria.  She is taking Azo   Review of Systems  All other systems reviewed and are negative.  See HPI   Past Medical History:  Diagnosis Date   Anxiety    + panic attacks in past    Arthritis    OA, hip, back- lumbar spine    ASTHMA    has Dulera for PRN use, not used in past month    CARDIAC ARRHYTHMIA    Cervical stenosis of spinal canal    DEPRESSION    Family history of adverse reaction to anesthesia    N&V- Mother of pt.    GERD (gastroesophageal reflux disease)    HYPERLIPIDEMIA    MIGRAINE HEADACHE    Zomig nasal spray, but hasn't used in long time   Myelosis ([place])    RHINOPLASTY, HX OF    UTI'S, HX OF     Social History   Socioeconomic History   Marital status: Single    Spouse name: Not on file   Number of children: 1   Years of education: 22   Highest education level: Not on file  Occupational History   Occupation: Freight forwarder  Tobacco Use   Smoking status: Never   Smokeless tobacco: Never   Tobacco comments:    separated 01/2013. 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 Use: Never used  Substance and Sexual Activity    Alcohol use: Yes    Alcohol/week: 1.0 - 2.0 standard drink of alcohol    Types: 1 - 2 Glasses of wine per week   Drug use: No   Sexual activity: Not on file  Other Topics Concern   Not on file  Social History Narrative   Manager for [employer]    Not married    50 year old daughter          Fun: Travel, eat, cook, read   Social Determinants of Health   Financial Resource Strain: Not on file  Food Insecurity: Not on file  Transportation Needs: Not on file  Physical Activity: Not on file  Stress: Not on file  Social Connections: Not on file  Intimate Partner Violence: Not on file    Past Surgical History:  Procedure Laterality Date   arthroscopic labral repair Left 11/2007   JOINT REPLACEMENT Right 2011   hip   OPEN REPAIR SPONTANEOUS DISLOCATION HIP  03/2008   @ [place]   Right Arthroscopic labral repair  07/2008   [place] rod T5-L3   TOTAL HIP REVISION Right 08/25/2015   Procedure: RIGHT TOTAL  HIP REVISION, ORIF PERI-PROSTHETIC FRACTURE;  Surgeon: Frederik Pear, MD;  Location: [facility];  Service: Orthopedics;  Laterality: Right;    Family History  Problem Relation Age of Onset   Arthritis Mother    Hyperlipidemia Mother    Arthritis Other    Colon cancer Other    Diabetes Other        grandparent   Lung cancer Other    Arthritis Father        OA and RA    Arthritis Sister    Ehlers-Danlos syndrome Sister    Arthritis Brother     Allergies  Allergen Reactions   Augmentin [Amoxicillin-Pot Clavulanate] Rash and Nausea Only    + abdominal cramping    Scopolamine Other (See Comments)    Other Reaction: Migraines   Oxycodone-Acetaminophen Other (See Comments)    Hypersensitive, /w itching    Iohexol      Desc: PREMED WITH BENADRYL, only oral contrast /w CAT, not MRI contrast     Morphine     hallucinations   Tramadol Other (See Comments)    Other Reaction: hallucinations, sweating    Current Outpatient  Medications on File Prior to Visit  Medication Sig Dispense Refill   carisoprodol (SOMA) 350 MG tablet Take 350 mg by mouth at bedtime.     FLUoxetine (PROZAC) 20 MG capsule Take 20 mg by mouth daily.     meloxicam (MOBIC) 15 MG tablet Take 1 tablet (15 mg total) by mouth daily. 30 tablet 0   Omega-3 Fatty Acids (FISH OIL) 1000 MG CAPS Take by mouth daily.       No current facility-administered medications on file prior to visit.    BP 110/82   Pulse 85   Temp 98.4 F (36.9 C) (Oral)   Ht '5\' 8"'$  (1.727 m)   Wt 179 lb (81.2 kg)   LMP  (LMP Unknown)   SpO2 98%   BMI 27.22 kg/m       Objective:   Physical Exam Vitals and nursing note reviewed.  Constitutional:      Appearance: Normal appearance.  Cardiovascular:     Rate and Rhythm: Normal rate and regular rhythm.     Pulses: Normal pulses.     Heart sounds: Normal heart sounds.  Pulmonary:     Effort: Pulmonary effort is normal.     Breath sounds: Normal breath sounds.  Abdominal:     General: Abdomen is flat. Bowel sounds are normal. There is no distension.     Palpations: Abdomen is soft.     Tenderness: There is abdominal tenderness. There is left CVA tenderness (?). There is no right CVA tenderness.     Hernia: No hernia is present.  Musculoskeletal:        General: Normal range of motion.  Skin:    General: Skin is warm and dry.  Neurological:     General: No focal deficit present.     Mental Status: She is alert and oriented to person, place, and time.  Psychiatric:        Mood and Affect: Mood normal.        Behavior: Behavior normal.        Thought Content: Thought content normal.        Judgment: Judgment normal.        Assessment & Plan:  1. Dysuria  - Culture, Urine; Future - POC Urinalysis Dipstick  + leuks and nitrites  - Will wait for culture to come back  and then treat   Dorothyann Peng, NP

## 2022-09-09 ENCOUNTER — Other Ambulatory Visit: Payer: Self-pay | Admitting: Adult Health

## 2022-09-09 ENCOUNTER — Telehealth: Payer: Self-pay | Admitting: Adult Health

## 2022-09-09 LAB — URINE CULTURE
MICRO NUMBER:: 14461157
SPECIMEN QUALITY:: ADEQUATE

## 2022-09-09 MED ORDER — CIPROFLOXACIN HCL 250 MG PO TABS: 250.0000 mg | ORAL_TABLET | Freq: Two times a day (BID) | ORAL | 0 refills | Status: DC

## 2022-09-09 MED ORDER — CIPROFLOXACIN HCL 500 MG PO TABS: 500.0000 mg | ORAL_TABLET | Freq: Two times a day (BID) | ORAL | 0 refills | Status: AC

## 2022-09-09 NOTE — Telephone Encounter (Signed)
Please get more information about the reason for the call

## 2022-09-09 NOTE — Telephone Encounter (Signed)
Pt call and stated she want Tommi Rumps to give her a call.

## 2022-10-12 ENCOUNTER — Encounter: Payer: 59 | Admitting: Adult Health

## 2023-03-29 ENCOUNTER — Other Ambulatory Visit: Payer: Self-pay | Admitting: *Deleted

## 2023-03-29 DIAGNOSIS — M419 Scoliosis, unspecified: Secondary | ICD-10-CM

## 2023-04-08 ENCOUNTER — Ambulatory Visit
Admission: RE | Admit: 2023-04-08 | Discharge: 2023-04-08 | Disposition: A | Discharge status: Home / Self Care | Payer: 59 | Source: Ambulatory Visit | Attending: *Deleted | Admitting: *Deleted

## 2023-04-08 DIAGNOSIS — M419 Scoliosis, unspecified: Secondary | ICD-10-CM

## 2023-04-29 ENCOUNTER — Other Ambulatory Visit: Payer: Self-pay | Admitting: Orthopaedic Surgery

## 2023-04-29 DIAGNOSIS — M47816 Spondylosis without myelopathy or radiculopathy, lumbar region: Secondary | ICD-10-CM

## 2023-05-03 ENCOUNTER — Encounter: Payer: Self-pay | Admitting: Adult Health

## 2023-05-03 ENCOUNTER — Ambulatory Visit: Payer: 59 | Admitting: Adult Health

## 2023-05-03 VITALS — BP 122/70 | HR 66 | Temp 98.0°F | Ht 68.0 in | Wt 189.0 lb

## 2023-05-03 DIAGNOSIS — F5101 Primary insomnia: Secondary | ICD-10-CM | POA: Diagnosis not present

## 2023-05-03 DIAGNOSIS — Z01818 Encounter for other preprocedural examination: Secondary | ICD-10-CM | POA: Diagnosis not present

## 2023-05-03 LAB — BASIC METABOLIC PANEL
BUN: 20 mg/dL (ref 6–23)
CO2: 24 meq/L (ref 19–32)
Calcium: 8.8 mg/dL (ref 8.4–10.5)
Chloride: 105 meq/L (ref 96–112)
Creatinine, Ser: 0.83 mg/dL (ref 0.40–1.20)
GFR: 76.18 mL/min (ref >60.00)
Glucose, Bld: 88 mg/dL (ref 70–99)
Potassium: 4.4 meq/L (ref 3.5–5.1)
Sodium: 139 meq/L (ref 135–145)

## 2023-05-03 LAB — PROTIME-INR
INR: 1 ratio (ref 0.8–1.0)
Prothrombin Time: 10.9 s (ref 9.6–13.1)

## 2023-05-03 LAB — CBC WITH DIFFERENTIAL/PLATELET
Basophils Absolute: 0 10*3/uL (ref 0.0–0.1)
Basophils Relative: 0.6 % (ref 0.0–3.0)
Eosinophils Absolute: 0.1 10*3/uL (ref 0.0–0.7)
Eosinophils Relative: 1.6 % (ref 0.0–5.0)
HCT: 47.9 % — ABNORMAL HIGH (ref 36.0–46.0)
Hemoglobin: 15.7 g/dL — ABNORMAL HIGH (ref 12.0–15.0)
Lymphocytes Relative: 34.8 % (ref 12.0–46.0)
Lymphs Abs: 2.3 10*3/uL (ref 0.7–4.0)
MCHC: 32.9 g/dL (ref 30.0–36.0)
MCV: 94.7 fl (ref 78.0–100.0)
Monocytes Absolute: 0.4 10*3/uL (ref 0.1–1.0)
Monocytes Relative: 6.4 % (ref 3.0–12.0)
Neutro Abs: 3.7 10*3/uL (ref 1.4–7.7)
Neutrophils Relative %: 56.6 % (ref 43.0–77.0)
Platelets: 267 10*3/uL (ref 150.0–400.0)
RBC: 5.06 Mil/uL (ref 3.87–5.11)
RDW: 15.5 % (ref 11.5–15.5)
WBC: 6.6 10*3/uL (ref 4.0–10.5)

## 2023-05-03 MED ORDER — QUVIVIQ 25 MG PO TABS: 25.0000 mg | ORAL_TABLET | Freq: Every evening | ORAL | 0 refills | Status: DC

## 2023-05-03 NOTE — Progress Notes (Signed)
Subjective:    Patient ID: Alicia Dunn, female    DOB: 08/12/1961, 62 y.o.   MRN: [id number]  HPI 62 year old female who  has a past medical history of Anxiety, Arthritis, ASTHMA, CARDIAC ARRHYTHMIA, Cervical stenosis of spinal canal, DEPRESSION, Family history of adverse reaction to anesthesia, GERD (gastroesophageal reflux disease), HYPERLIPIDEMIA, MIGRAINE HEADACHE, Myelosis ([facility]), RHINOPLASTY, HX OF, Sleep apnea, and UTI'S, HX OF.  She presents to the clinic today for preoperative clearance. She will be having a revision of L2-5 laminectomy/PSF/TLIF. [facility]. Her surgery is scheduled for Oct 29th 2024.   Additionally, she reports that she has had trouble sleeping, she does have a history of insomnia and was on Ambien in the past.  Was able to wean herself off this.  She does see telepsychiatrist who prescribed her hydroxyzine but she stopped taking this as it did not work for her.  She is also on Prozac to help with anxiety and depression.  Her psychiatrist wanted her to talk to me about starting her on a medication for insomnia such as Qulipta.  She does not want to be on anything long-term just so she can get some sleep due to chronic low back pain as well as sleep after the surgery.   Review of Systems See HPI   Past Medical History:  Diagnosis Date   Anxiety    + panic attacks in past    Arthritis    OA, hip, back- lumbar spine    ASTHMA    has Dulera for PRN use, not used in past month    CARDIAC ARRHYTHMIA    Cervical stenosis of spinal canal    DEPRESSION    Family history of adverse reaction to anesthesia    N&V- Mother of pt.    GERD (gastroesophageal reflux disease)    HYPERLIPIDEMIA    MIGRAINE HEADACHE    Zomig nasal spray, but hasn't used in long time   Myelosis ([facility])    RHINOPLASTY, HX OF    Sleep apnea    UTI'S, HX OF     Social History   Socioeconomic History   Marital status: Single    Spouse name: Not  on file   Number of children: 1   Years of education: 54   Highest education level: Not on file  Occupational History   Occupation: Production designer, theatre/television/film  Tobacco Use   Smoking status: Never   Smokeless tobacco: Never   Tobacco comments:    separated 01/2013. 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 status: Never Used  Substance and Sexual Activity   Alcohol use: Yes    Alcohol/week: 1.0 - 2.0 standard drink of alcohol    Types: 1 - 2 Glasses of wine per week   Drug use: No   Sexual activity: Not on file  Other Topics Concern   Not on file  Social History Narrative   Production designer, theatre/television/film for [employer]    Not married    40 year old daughter          Fun: Travel, eat, Financial risk analyst, read   Social Determinants of Health   Financial Resource Strain: Not on file  Food Insecurity: Not on file  Transportation Needs: Not on file  Physical Activity: Not on file  Stress: Not on file  Social Connections: Not on file  Intimate Partner Violence: Not on file    Past  Surgical History:  Procedure Laterality Date   arthroscopic labral repair Left 11/2007   JOINT REPLACEMENT Right 2011   hip   OPEN REPAIR SPONTANEOUS DISLOCATION HIP  03/2008   @ [facility]   RHINOPLASTY  1981   Right Arthroscopic labral repair  07/2008   SPINAL FUSION  1986   [place] rod T5-L3   TOTAL HIP REVISION Right 08/25/2015   Procedure: RIGHT TOTAL HIP REVISION, ORIF PERI-PROSTHETIC FRACTURE;  Surgeon: Gean Birchwood, MD;  Location[facility];  Service: Orthopedics;  Laterality: Right;    Family History  Problem Relation Age of Onset   Arthritis Mother    Hyperlipidemia Mother    Arthritis Other    Colon cancer Other    Diabetes Other        grandparent   Lung cancer Other    Arthritis Father        OA and RA    Arthritis Sister    Ehlers-Danlos syndrome Sister    Arthritis Brother     Allergies  Allergen Reactions   Augmentin [Amoxicillin-Pot Clavulanate] Rash and Nausea Only    +  abdominal cramping    Scopolamine Other (See Comments)    Other Reaction: Migraines   Oxycodone-Acetaminophen Other (See Comments)    Hypersensitive, /w itching    Bactrim [Sulfamethoxazole-Trimethoprim] Diarrhea   Iohexol      Desc: PREMED WITH BENADRYL, only oral contrast /w CAT, not MRI contrast     Morphine     hallucinations   Tramadol Other (See Comments)    Other Reaction: hallucinations, sweating    Current Outpatient Medications on File Prior to Visit  Medication Sig Dispense Refill   carisoprodol (SOMA) 350 MG tablet Take 350 mg by mouth at bedtime.     Cholecalciferol (VITAMIN D) 50 MCG (2000 UT) CAPS Take 2,000 Units by mouth daily.     desonide (DESOWEN) 0.05 % ointment 2 (two) times daily.     FLUoxetine (PROZAC) 20 MG capsule Take 20 mg by mouth daily.     meloxicam (MOBIC) 15 MG tablet Take 1 tablet (15 mg total) by mouth daily. 30 tablet 0   Omega-3 Fatty Acids (FISH OIL) 1000 MG CAPS Take by mouth daily.       Turmeric (QC TUMERIC COMPLEX PO) Take by mouth.     Zinc 10 MG LOZG Use as directed in the mouth or throat daily.     No current facility-administered medications on file prior to visit.    BP 122/70   Pulse 66   Temp 98 F (36.7 C) (Oral)   Ht 5\' 8"  (1.727 m)   Wt 189 lb (85.7 kg)   LMP  (LMP Unknown)   SpO2 97%   BMI 28.74 kg/m       Objective:   Physical Exam Vitals and nursing note reviewed.  Constitutional:      Appearance: Normal appearance.  Cardiovascular:     Rate and Rhythm: Normal rate and regular rhythm.     Pulses: Normal pulses.     Heart sounds: Normal heart sounds.  Pulmonary:     Effort: Pulmonary effort is normal.     Breath sounds: Normal breath sounds.  Musculoskeletal:        General: Normal range of motion.  Skin:    General: Skin is warm and dry.  Neurological:     General: No focal deficit present.     Mental Status: She is alert and oriented to person, place, and time.  Psychiatric:  Mood and Affect:  Mood normal.        Behavior: Behavior normal.        Thought Content: Thought content normal.        Judgment: Judgment normal.       Assessment & Plan:  1. Preoperative clearance  - EKG 12-Lead-sinus rhythm with premature supraventricular complexes.  Nonspecific ST and T wave abnormality, rate 63.  This consistent with previous EKG in 2017. - CBC with Differential/Platelet; Future - Basic Metabolic Panel; Future - Protime-INR; Future  2. Primary insomnia  - Daridorexant HCl (QUVIVIQ) 25 MG TABS; Take 1 tablet (25 mg total) by mouth at bedtime.  Dispense: 30 tablet; Refill: 0 - She will follow up if medication is  not working    Encouraged to have CPE done as it has been 5 years since last CPE    Shirline Frees, NP   Time spent with patient today was 21 minutes which consisted of chart review, discussing insomnia and preoperative clearance, work up, treatment answering questions and documentation. This did not include time for EKG

## 2023-05-10 ENCOUNTER — Telehealth: Payer: Self-pay | Admitting: Adult Health

## 2023-05-10 ENCOUNTER — Other Ambulatory Visit: Payer: Self-pay | Admitting: Adult Health

## 2023-05-10 MED ORDER — ZOLPIDEM TARTRATE 5 MG PO TABS: 5.0000 mg | ORAL_TABLET | Freq: Every evening | ORAL | 1 refills | Status: DC | PRN

## 2023-05-10 NOTE — Telephone Encounter (Signed)
Pt call and stated her Insurance will not paid for Quviviq  and want Kandee Keen to call in some Ambien for her.

## 2023-05-10 NOTE — Telephone Encounter (Signed)
Please advise 

## 2023-05-11 NOTE — Telephone Encounter (Signed)
Patient notified of update  and verbalized understanding. 

## 2023-05-21 ENCOUNTER — Ambulatory Visit
Admission: RE | Admit: 2023-05-21 | Discharge: 2023-05-21 | Disposition: A | Discharge status: Home / Self Care | Payer: 59 | Source: Ambulatory Visit | Attending: Orthopaedic Surgery | Admitting: Orthopaedic Surgery

## 2023-05-21 DIAGNOSIS — M47816 Spondylosis without myelopathy or radiculopathy, lumbar region: Secondary | ICD-10-CM

## 2023-06-15 ENCOUNTER — Encounter: Payer: Self-pay | Admitting: Adult Health

## 2023-06-16 ENCOUNTER — Telehealth: Payer: Self-pay

## 2023-06-16 NOTE — Transitions of Care (Post Inpatient/ED Visit) (Signed)
06/16/2023  Name: Alicia Dunn MRN: [id number] DOB: Dec 23, 1960  Today's TOC FU Call Status: Today's TOC FU Call Status:: Successful TOC FU Call Completed TOC FU Call Complete Date: 06/16/23 Patient's Name and Date of Birth confirmed.  Transition Care Management Follow-up Telephone Call Date of Discharge: 06/15/23 Discharge Facility: Other (Non[facility]) Name of Other ([facility]) Discharge Facility: [facility] Type of Discharge: Inpatient Admission Primary Inpatient Discharge Diagnosis:: "surgery,elective" How have you been since you were released from the hospital?: Same (pt voices she is doing ok-currnet pain 7/10-hasn't taken any pain med today-encouraged to do so, no BM since surgery-taking Senokot BID-will try prune juice & other measures today, she is up walking & moving around-) Any questions or concerns?: Yes Patient Questions/Concerns:: patient vocies she needs refill on Ambien Patient Questions/Concerns Addressed: Other: (Discussed with pt contacting pharmacy to have refill request sent to provider and she will do so)  Items Reviewed: Did you receive and understand the discharge instructions provided?: Yes Medications obtained,verified, and reconciled?: Yes (Medications Reviewed) Any new allergies since your discharge?: No Dietary orders reviewed?: Yes Type of Diet Ordered:: low salt/heart healthy Do you have support at home?: Yes People in Home: child(ren), adult Name of Support/Comfort Primary Source: daughter  Medications Reviewed Today: Medications Reviewed Today     Reviewed by Charlyn Minerva, RN (Registered Nurse) on 06/16/23 at 1126  Med List Status: <None>   Medication Order Taking? Sig Documenting Provider Last Dose Status Informant  carisoprodol (SOMA) 350 MG tablet [id number] Yes Take 350 mg by mouth at bedtime. [provider] Taking Active   Cholecalciferol (VITAMIN D) 50 MCG (2000 UT) CAPS [id number] Yes Take 2,000 Units  by mouth daily. [provider] Taking Active   desonide (DESOWEN) 0.05 % ointment [id number] Yes 2 (two) times daily. [provider] Taking Active   FLUoxetine (PROZAC) 20 MG capsule [id number] Yes Take 20 mg by mouth daily. [provider] Taking Active   HYDROcodone-acetaminophen (NORCO) 10-325 MG tablet [id number] Yes Take 1 tablet by mouth every 4 (four) hours as needed for moderate pain (pain score 4-6). [provider] Taking Active Self  meloxicam (MOBIC) 15 MG tablet [id number] No Take 1 tablet (15 mg total) by mouth daily.  Patient not taking: Reported on 06/16/2023   Veryl Speak, FNP Not Taking Active Self  Omega-3 Fatty Acids (FISH OIL) 1000 MG CAPS [id number] Yes Take by mouth daily.   [provider] Taking Active Self  senna (SENOKOT) 8.6 MG tablet [id number] Yes Take 1 tablet by mouth 2 (two) times daily. [provider] Taking Active Self  Turmeric (QC TUMERIC COMPLEX PO) [id number] Yes Take by mouth. [provider] Taking Active   Zinc 10 MG LOZG [id number] Yes Use as directed in the mouth or throat daily. [provider] Taking Active   zolpidem (AMBIEN) 5 MG tablet [id number] Yes Take 1 tablet (5 mg total) by mouth at bedtime as needed for sleep. Nafziger, Kandee Keen, NP Taking Active             Home Care and Equipment/Supplies: Were Home Health Services Ordered?: NA Any new equipment or medical supplies ordered?: NA  Functional Questionnaire: Do you need assistance with bathing/showering or dressing?: Yes Do you need assistance with meal preparation?: Yes Do you need assistance with eating?: No Do you have difficulty maintaining continence: No Do you need assistance with getting out of bed/getting out of a chair/moving?: No Do you have difficulty managing or taking your medications?: No  Follow up appointments reviewed: PCP Follow-up appointment confirmed?: No (declined) MD Provider Line  Number:[phone] Given: No Specialist Hospital Follow-up appointment confirmed?: Yes Follow-Up Specialty Provider:: pt unsure of date but confirms she has 4wk f/u appt with surgeon scheduled Do you need transportation to your follow-up appointment?: No Do you understand care options if your condition(s) worsen?: Yes-patient verbalized understanding  SDOH Interventions Today    Flowsheet Row Most Recent Value  SDOH Interventions   Food Insecurity Interventions Intervention Not Indicated  Transportation Interventions Intervention Not Indicated       Antionette Fairy, RN,BSN,CCM RN Care Manager Transitions of Care  [facility]  Direct Phone: [phone] Toll Free: [phone] Fax: [phone]

## 2023-06-20 ENCOUNTER — Other Ambulatory Visit: Payer: Self-pay | Admitting: Adult Health

## 2023-06-21 NOTE — Telephone Encounter (Signed)
Okay for refill?  

## 2023-06-21 NOTE — Telephone Encounter (Signed)
FYI

## 2023-06-21 NOTE — Telephone Encounter (Signed)
Pt called to say she is Post-Op and having a lot of trouble sleeping. Pt is asking for a 30 day supply.  [id number] [place], [place] - [address] AT [facility] OF [address] & [facility] [address] Phone: [phone]  Fax: [phone]

## 2023-09-02 ENCOUNTER — Other Ambulatory Visit: Payer: Self-pay | Admitting: Orthopaedic Surgery

## 2023-09-02 DIAGNOSIS — M5416 Radiculopathy, lumbar region: Secondary | ICD-10-CM

## 2023-09-02 DIAGNOSIS — M4326 Fusion of spine, lumbar region: Secondary | ICD-10-CM

## 2023-09-07 ENCOUNTER — Ambulatory Visit
Admission: RE | Admit: 2023-09-07 | Discharge: 2023-09-07 | Disposition: A | Discharge status: Home / Self Care | Payer: 59 | Source: Ambulatory Visit | Attending: Orthopaedic Surgery | Admitting: Orthopaedic Surgery

## 2023-09-07 DIAGNOSIS — M4326 Fusion of spine, lumbar region: Secondary | ICD-10-CM

## 2023-09-07 DIAGNOSIS — M5416 Radiculopathy, lumbar region: Secondary | ICD-10-CM
